# Patient Record
Sex: Female | Born: 1960 | Race: Black or African American | Hispanic: No | Smoking: Never smoker
Health system: Southern US, Community
[De-identification: ages and names within clinical notes are randomized; demographics above are authoritative.]

## PROBLEM LIST (undated history)

## (undated) DIAGNOSIS — K529 Noninfective gastroenteritis and colitis, unspecified: Secondary | ICD-10-CM

## (undated) DIAGNOSIS — Z8711 Personal history of peptic ulcer disease: Secondary | ICD-10-CM

## (undated) DIAGNOSIS — M25559 Pain in unspecified hip: Secondary | ICD-10-CM

## (undated) DIAGNOSIS — D259 Leiomyoma of uterus, unspecified: Secondary | ICD-10-CM

## (undated) DIAGNOSIS — N3941 Urge incontinence: Secondary | ICD-10-CM

## (undated) DIAGNOSIS — Z973 Presence of spectacles and contact lenses: Secondary | ICD-10-CM

## (undated) DIAGNOSIS — Z2821 Immunization not carried out because of patient refusal: Secondary | ICD-10-CM

## (undated) DIAGNOSIS — N3944 Nocturnal enuresis: Secondary | ICD-10-CM

## (undated) DIAGNOSIS — E669 Obesity, unspecified: Secondary | ICD-10-CM

## (undated) HISTORY — DX: Personal history of peptic ulcer disease: Z87.11

## (undated) HISTORY — PX: COLONOSCOPY: SHX174

## (undated) HISTORY — PX: ABDOMINAL HYSTERECTOMY: SHX81

## (undated) HISTORY — DX: Urge incontinence: N39.41

## (undated) HISTORY — DX: Obesity, unspecified: E66.9

## (undated) HISTORY — PX: KNEE ARTHROSCOPY: SUR90

## (undated) HISTORY — PX: WISDOM TOOTH EXTRACTION: SHX21

## (undated) HISTORY — PX: HIP SURGERY: SHX245

## (undated) HISTORY — DX: Presence of spectacles and contact lenses: Z97.3

## (undated) HISTORY — DX: Immunization not carried out because of patient refusal: Z28.21

## (undated) HISTORY — DX: Leiomyoma of uterus, unspecified: D25.9

## (undated) HISTORY — DX: Nocturnal enuresis: N39.44

---

## 1998-02-02 ENCOUNTER — Emergency Department (HOSPITAL_COMMUNITY): Admission: EM | Admit: 1998-02-02 | Discharge: 1998-02-02 | Payer: Self-pay | Admitting: Emergency Medicine

## 2000-11-19 ENCOUNTER — Emergency Department (HOSPITAL_COMMUNITY): Admission: EM | Admit: 2000-11-19 | Discharge: 2000-11-19 | Payer: Self-pay | Admitting: Emergency Medicine

## 2000-11-19 ENCOUNTER — Encounter: Payer: Self-pay | Admitting: Emergency Medicine

## 2001-08-26 ENCOUNTER — Ambulatory Visit (HOSPITAL_COMMUNITY): Admission: RE | Admit: 2001-08-26 | Discharge: 2001-08-26 | Payer: Self-pay | Admitting: *Deleted

## 2001-09-03 ENCOUNTER — Inpatient Hospital Stay (HOSPITAL_COMMUNITY): Admission: RE | Admit: 2001-09-03 | Discharge: 2001-09-06 | Payer: Self-pay | Admitting: *Deleted

## 2002-03-04 ENCOUNTER — Emergency Department (HOSPITAL_COMMUNITY): Admission: EM | Admit: 2002-03-04 | Discharge: 2002-03-04 | Payer: Self-pay | Admitting: Emergency Medicine

## 2002-07-02 ENCOUNTER — Emergency Department (HOSPITAL_COMMUNITY): Admission: EM | Admit: 2002-07-02 | Discharge: 2002-07-02 | Payer: Self-pay | Admitting: Emergency Medicine

## 2006-06-24 ENCOUNTER — Ambulatory Visit: Payer: Self-pay | Admitting: Family Medicine

## 2006-07-23 ENCOUNTER — Ambulatory Visit: Payer: Self-pay | Admitting: Family Medicine

## 2007-07-22 ENCOUNTER — Ambulatory Visit: Payer: Self-pay | Admitting: Family Medicine

## 2007-07-31 ENCOUNTER — Ambulatory Visit: Payer: Self-pay

## 2007-08-26 ENCOUNTER — Ambulatory Visit: Payer: Self-pay | Admitting: Internal Medicine

## 2007-09-01 ENCOUNTER — Ambulatory Visit: Payer: Self-pay

## 2007-09-01 ENCOUNTER — Ambulatory Visit: Payer: Self-pay | Admitting: Internal Medicine

## 2007-09-01 LAB — CONVERTED CEMR LAB
BUN: 4 mg/dL — ABNORMAL LOW (ref 6–23)
CO2: 28 meq/L (ref 19–32)
Calcium: 8.8 mg/dL (ref 8.4–10.5)
Chloride: 105 meq/L (ref 96–112)
Creatinine, Ser: 0.5 mg/dL (ref 0.4–1.2)
GFR calc Af Amer: 172 mL/min
GFR calc non Af Amer: 142 mL/min
Glucose, Bld: 100 mg/dL — ABNORMAL HIGH (ref 70–99)
Potassium: 3.6 meq/L (ref 3.5–5.1)
Pro B Natriuretic peptide (BNP): 6 pg/mL (ref 0.0–100.0)
Sodium: 137 meq/L (ref 135–145)

## 2007-09-03 ENCOUNTER — Ambulatory Visit: Payer: Self-pay | Admitting: Pulmonary Disease

## 2007-09-15 ENCOUNTER — Ambulatory Visit (HOSPITAL_BASED_OUTPATIENT_CLINIC_OR_DEPARTMENT_OTHER): Admission: RE | Admit: 2007-09-15 | Discharge: 2007-09-15 | Payer: Self-pay | Admitting: Pulmonary Disease

## 2007-09-15 ENCOUNTER — Encounter: Payer: Self-pay | Admitting: Pulmonary Disease

## 2007-09-30 ENCOUNTER — Ambulatory Visit: Payer: Self-pay | Admitting: Internal Medicine

## 2007-10-02 ENCOUNTER — Ambulatory Visit: Payer: Self-pay | Admitting: Pulmonary Disease

## 2007-10-02 ENCOUNTER — Telehealth (INDEPENDENT_AMBULATORY_CARE_PROVIDER_SITE_OTHER): Payer: Self-pay | Admitting: *Deleted

## 2007-10-20 ENCOUNTER — Encounter: Payer: Self-pay | Admitting: Pulmonary Disease

## 2007-10-20 DIAGNOSIS — G4730 Sleep apnea, unspecified: Secondary | ICD-10-CM | POA: Insufficient documentation

## 2008-03-19 ENCOUNTER — Ambulatory Visit: Payer: Self-pay | Admitting: Family Medicine

## 2008-03-30 ENCOUNTER — Ambulatory Visit: Payer: Self-pay | Admitting: Family Medicine

## 2008-08-13 ENCOUNTER — Ambulatory Visit: Payer: Self-pay | Admitting: Family Medicine

## 2008-11-02 ENCOUNTER — Ambulatory Visit: Payer: Self-pay | Admitting: Family Medicine

## 2008-11-09 ENCOUNTER — Encounter: Admission: RE | Admit: 2008-11-09 | Discharge: 2008-11-09 | Payer: Self-pay | Admitting: Family Medicine

## 2009-06-06 ENCOUNTER — Ambulatory Visit: Payer: Self-pay | Admitting: Diagnostic Radiology

## 2009-06-06 ENCOUNTER — Ambulatory Visit (HOSPITAL_BASED_OUTPATIENT_CLINIC_OR_DEPARTMENT_OTHER): Admission: RE | Admit: 2009-06-06 | Discharge: 2009-06-06 | Payer: Self-pay | Admitting: Physician Assistant

## 2009-06-09 ENCOUNTER — Encounter: Admission: RE | Admit: 2009-06-09 | Discharge: 2009-06-09 | Payer: Self-pay | Admitting: Family Medicine

## 2009-06-09 LAB — HM MAMMOGRAPHY

## 2009-07-05 ENCOUNTER — Ambulatory Visit: Payer: Self-pay | Admitting: Family Medicine

## 2009-07-21 ENCOUNTER — Ambulatory Visit: Payer: Self-pay | Admitting: Family Medicine

## 2009-12-12 ENCOUNTER — Ambulatory Visit: Payer: Self-pay | Admitting: Physician Assistant

## 2010-09-27 ENCOUNTER — Ambulatory Visit: Payer: Self-pay | Admitting: Family Medicine

## 2010-10-19 ENCOUNTER — Ambulatory Visit
Admission: RE | Admit: 2010-10-19 | Discharge: 2010-10-19 | Payer: Self-pay | Source: Home / Self Care | Attending: Family Medicine | Admitting: Family Medicine

## 2011-02-27 NOTE — Assessment & Plan Note (Signed)
[facility]                            CARDIOLOGY OFFICE NOTE   Sonya White, Sonya White                       MRN:          [id number]  DATE:08/26/2007                            DOB:          04-25-1961    REASON FOR CONSULTATION:  Shortness of breath and chest discomfort.   HISTORY OF PRESENT ILLNESS:  Sonya White is a very pleasant 50 year old  woman with a history of obesity.  She denies any history of  hypertension, hyperlipidemia, no coronary artery disease.  She has never  had a stress test or cardiac catheterization.  She works as a [employer]a [employer] for [employer].   She says over the past 6 months she has had a sensation that her chest  is filling up.  She likens it to almost drowning in a pool.  She says  that it can happen at any time, it lasts about 1 second and then goes  away.  She can feel it coming on.  This Saturday she had multiple  episodes she said that lasted 4-5 seconds and then went away.  She was  minimally short of breath with it, denied any palpitations.  She denies  any orthopnea or PND.  She does have occasional minimal lower extremity  edema.  Functional capacity is very good.   REVIEW OF SYSTEMS:  She denies any nausea and vomiting.  She has  profound fatigue as well as heavy snoring.  She says she can fall asleep  just on a dime.  She denies any bleeding, no focal neurologic defects.  The remainder of the review of systems is negative except for HPI and  problem list.   PROBLEM LIST:  1. Obesity.  2. History of colon ulcers as child.  This has resolved.   MEDICATIONS:  None.   SURGICAL HISTORY:  She is status post hysterectomy in 2002.   SOCIAL HISTORY:  She has 2 children.  She is single, she is here with  her partner.  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  She  denies any tobacco use, occasional alcohol.   FAMILY HISTORY:  Mother died at 13 due to cancer.  She has multiple  aunts on  her father's side that have coronary artery disease.   PHYSICAL EXAMINATION:  She is in no acute distress, she ambulates around  the clinic without any respiratory difficulty.  Blood pressure initially  was 136/96, on manual recheck 122/88, weight is 234 pounds, pulse is 88.  HEENT:  Normal.  NECK:  Supple and thick, there is no JVD, carotids are 2+ bilaterally  without bruits, there is no lymphadenopathy or thyromegaly.  CARDIAC:  PMI is nondisplaced, she has a regular rate and rhythm with no  murmurs, rubs, or gallops.  LUNGS:  Clear.  ABDOMEN:  Obese, nontender, nondistended, no hepatosplenomegaly, no  bruits, no masses appreciated.  EXTREMITIES:  Warm with no cyanosis or clubbing, there is trace edema,  no rash, good distal pulses.  NEURO:  She is alert and oriented x3, cranial nerves are intact, moves  all 4 extremities without difficulty.  Affect is very pleasant.    She has sinus arrhythmia.  She had an echocardiogram which showed a  normal ejection fraction with just minimal left atrial enlargement.   ASSESSMENT:  1. Chest discomfort.  This does not sound like ischemia to me, however      it is possible that she is having atypical symptoms.  Her symptoms      actually sound more like she may be having premature atrial      contractions and premature ventricular contractions.  At this point      we will start with a treadmill Myoview to exclude ischemia given      her strong family history and also put a 48-hour Holter Monitor on      her.  2. Fatigue.  I suspect she has sleep apnea.  Will refer her to Dr.      Craige White for possible sleep study.  3. Lower extremity edema.  We will start her on hydrochlorothiazide      12.5 and potassium 20.  She will get a BMET and a BNP in one week.  4. Borderline hypertension.  We are starting her on      hydrochlorothiazide.  She will be followed with Sonya White.     Sonya White. Bensimhon, MD  Electronically Signed    DRB/MedQ  DD:  08/26/2007  DT: 08/26/2007  Job #: [id number]   cc:   Dr. Feliberto White  Sonya Helling, MD

## 2011-02-27 NOTE — Procedures (Signed)
NAMERAINELLE, SULEWSKI                ACCOUNT NO.:  [id number]   MEDICAL RECORD NO.:  [id number]          PATIENT TYPE:  OUT   LOCATION:  SLEEP CENTER                 FACILITY:  [facility]   PHYSICIAN:  Barbaraann Share, MD,FCCPDATE OF BIRTH:  02-15-63   DATE OF STUDY:  09/15/2007                            NOCTURNAL POLYSOMNOGRAM   REFERRING PHYSICIAN:  Barbaraann Share, MD,FCCP   INDICATION FOR STUDY:  Hypersomnia with sleep apnea.   EPWORTH SLEEPINESS SCORE:  23   MEDICATIONS:   SLEEP ARCHITECTURE:  The patient had a total sleep time of 321 minutes  with no slow wave sleep and only 42 minutes of REM.  Sleep onset latency  was mildly prolonged at 35 minutes and REM onset was normal.  Sleep  efficiency was mildly decreased at 85%.   RESPIRATORY DATA:  The patient was found to have 15 obstructive apneas  and 40 obstructive hypopnea's for an apnea/hypopnea index of 10 event  per hour.  The events occurred primarily during REM and there was  moderate snoring noted throughout.   OXYGEN DATA:  There was O2 desaturation as low as 86% with the patient's  obstructive events.   CARDIAC DATA:  No clinically significant arrhythmias were noted.   MOVEMENT-PARASOMNIA:  The patient was found to have no significant leg  jerks or abdomen behaviors.   IMPRESSIONS-RECOMMENDATIONS:  Mild obstructive sleep apnea/hypopnea  syndrome with an apnea/hypopnea index of 10 events per hour and oxygen  desaturation as low as 86%.  The patient's events occurred primarily  during REM and therefore, the patient may be more symptomatic for this  degree of sleep apnea.  Treatment options include weight loss alone is  applicable, upper airway surgery, oral appliance, and also CPAP.  Clinical correlation is suggested.     Barbaraann Share, MD,FCCP  Diplomate, American Board of Sleep  Medicine  Electronically Signed    [facility]/MEDQ  D:  10/02/2007 08:05:56  T:  10/02/2007 13:09:41  Job:  [id number]

## 2011-02-27 NOTE — Assessment & Plan Note (Signed)
[facility]                            CARDIOLOGY OFFICE NOTE   Sonya White, Sonya White                       MRN:          [id number]  DATE:09/30/2007                            DOB:          01-03-1961    PRIMARY CARE PHYSICIAN:  Dr. Elpidio Galea.   INTERVAL HISTORY:  Sonya White is a very pleasant 50 year old woman with  a history of obesity and no other significant medical history.  We saw  her last month for the first time for evaluation of some atypical chest  pain and shortness of breath.  Since that time, she has undergone a  Myoview, echocardiogram, and a Holter monitor.  These were all  completely normal.  She has also been to Dr. Craige Cotta and had a sleep study,  the results of which are pending.  She returns today for routine  followup.  She says she continues to get a sensation of something  filling up in her chest.  This happens just about every day, it lasts  just a couple of seconds and resolves.  She denies any overt chest pain  or significant dyspnea.  She says she can feel it coming and then it  just goes away.  She denies any syncope or presyncope with it.  She has  not had frank palpitations.  She did say that she had several of these  episodes while on the monitor.  We did not detect anything, although  there was no a diary submitted.   CURRENT MEDICATIONS:  None.   PHYSICAL EXAMINATION:  Sh is well-appearing in no acute distress.  Ambulates around the clinic without any respiratory difficulty.  Blood pressure is 119/90.  Heart rate is 100.  Weight is 229.  HEENT:  Normal.  NECK:  Supple.  No JVD.  Carotids are 2+ bilaterally without any bruits.  There is no lymphadenopathy or thyromegaly.  CARDIAC:  PMI is nondisplaced.  She has a regular rate and rhythm.  No  murmurs, rubs or gallops.  LUNGS:  Clear.  ABDOMEN:  Obese, nontender, nondistended.  No hepatosplenomegaly.  No  bruits.  No masses.  Good bowel sounds.  EXTREMITIES:  Warm  with no clubbing, cyanosis or edema.  No rash.  NEURO:  Alert and oriented x3.  Cranial nerves II-XII are intact.  Moves  all four extremities without difficulty.  Affect is pleasant.   ASSESSMENT/PLAN:  Abnormal chest sensation:  Given her symptoms, my  suspicion is that she is probably having premature atrial contractions  or premature ventricular contractions, and we have just missed these on  the monitor.  We have given her a prescription for Toprol 25 mg a day to  see if this helps at all.  If if does not help, I told her she could try  50 mg a day.  Also, we did discuss the possibility of it being reflux  disease, although the symptoms would be atypical.  Should they continue,  we have suggested that she may even try a little bit of over-the-counter  Prilosec.  She will try these methods, and we will  see her back in six  months for routine  followup.  We also reminded her of the need to avoid caffeine as much as  possible, which she has been doing.     Sonya Buckles. Bensimhon, MD  Electronically Signed    DRB/MedQ  DD: 09/30/2007  DT: 09/30/2007  Job #: [id number]   cc:   Dr. Elpidio Galea  Sonya Helling, MD

## 2011-02-27 NOTE — Assessment & Plan Note (Signed)
[facility]                             PULMONARY OFFICE NOTE   Sonya White, Sonya White                       MRN:          [id number]  DATE:09/03/2007                            DOB:          07/25/1961    SLEEP MEDICINE CONSULTATION:   HISTORY OF PRESENT ILLNESS:  The patient is a very pleasant 50 year old  black female who I have been asked to see for possible obstructive sleep  apnea.  The patient states that she has been noted to have very loud  snoring but no one has ever mentioned pauses in her breathing during  sleep.  She does admit to occasional choking arousals.  The patient  typically gets to bed between 11 and 1 and gets up at 7 a.m. to start  her day.  She does not feel rested upon arising.  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  She will often roll down her windows and play  loud music in order to stay awake.  If she ever gets very sleepy, she  will pull over and take breaks.  The patient also notes severe  sleepiness during periods of inactivity such as watching TV or movies  and has even fallen asleep on rare occasions with conversation.  Of  note, her weight is up 20 pounds over the last few years.   PAST MEDICAL HISTORY:  1. A C-section in the past.  2. Status post hysterectomy.  3. The patient has also seen cardiology for a feeling of palpitations.   CURRENT MEDICATIONS:  None.   The patient has no known drug allergies.   SOCIAL HISTORY:  She is divorced and has children.  She has never  smoked.   FAMILY HISTORY:  Remarkable for her father having congestive heart  failure and mother having multiple myeloma.   REVIEW OF SYSTEMS:  As per history of present illness.  Also see patient  intake form documented on the chart.   PHYSICAL EXAM:  GENERAL:  She is an obese black female in no acute  distress.  Blood pressure is 126/86, pulse 79, temperature  is 99, weight is 238  pounds.  She is 5 feet 3 inches tall.  O2 saturation on room air is 97%.  HEENT:  Pupils equal, round and reactive to light and accommodation.  Extraocular muscles are intact.  Nares show turbinate hypertrophy but  they are patent.  Oropharynx does show significant tissue redundancy  with elongation of the soft palate and uvula and a large tongue for her  oral cavity.  NECK:  Supple without JVD or lymphadenopathy.  There is no palpable  thyromegaly.  CHEST:  Totally clear.  CARDIAC:  Regular rate and rhythm.  No murmurs, rubs or gallops.  ABDOMEN:  Soft, nontender, with good bowel sounds.  GENITAL, RECTAL, BREASTS:  Exams not done and not indicated.  Lower extremities are without edema.  Pulses are intact distally.  NEUROLOGIC:  Alert and oriented with no obvious motor deficits.   IMPRESSION:  Probable obstructive sleep apnea.  The patient gives a very  good history for this and certainly has abnormal upper airway anatomy  and is obese.  I had a long discussion with her about the  pathophysiology of sleep apnea including the short-term quality of life  issues and the long-term cardiovascular issues.  At this point in time I  think there is enough suspicion to support proceeding with nocturnal  polysomnogram.   PLAN:  1. Schedule for sleep study.  2. I reminded the patient of her moral responsibility not to drive if      she is sleepy.  3. Work on weight loss.  4. The patient will follow up after the above.     Barbaraann Share, MD,FCCP  Electronically Signed    [facility]/MedQ  DD: 09/03/2007  DT: 09/04/2007  Job #: [id number]   cc:   Bevelyn Buckles. Bensimhon, MD  Sharlot Gowda, M.D.

## 2011-03-09 ENCOUNTER — Emergency Department (HOSPITAL_BASED_OUTPATIENT_CLINIC_OR_DEPARTMENT_OTHER)
Admission: EM | Admit: 2011-03-09 | Discharge: 2011-03-09 | Disposition: A | Discharge status: Home / Self Care | Payer: BC Managed Care – PPO | Attending: Emergency Medicine | Admitting: Emergency Medicine

## 2011-03-09 DIAGNOSIS — H9209 Otalgia, unspecified ear: Secondary | ICD-10-CM | POA: Insufficient documentation

## 2011-03-09 DIAGNOSIS — H6690 Otitis media, unspecified, unspecified ear: Secondary | ICD-10-CM | POA: Insufficient documentation

## 2011-05-22 ENCOUNTER — Ambulatory Visit (INDEPENDENT_AMBULATORY_CARE_PROVIDER_SITE_OTHER): Payer: BC Managed Care – PPO | Admitting: Medical

## 2011-05-22 ENCOUNTER — Telehealth: Payer: Self-pay | Admitting: *Deleted

## 2011-05-22 ENCOUNTER — Encounter: Payer: Self-pay | Admitting: Medical

## 2011-05-22 ENCOUNTER — Ambulatory Visit
Admission: RE | Admit: 2011-05-22 | Discharge: 2011-05-22 | Disposition: A | Discharge status: Home / Self Care | Payer: BC Managed Care – PPO | Source: Ambulatory Visit | Attending: Medical | Admitting: Medical

## 2011-05-22 VITALS — BP 132/90 | HR 60 | Temp 98.1°F | Ht 62.0 in | Wt 231.0 lb

## 2011-05-22 DIAGNOSIS — M79609 Pain in unspecified limb: Secondary | ICD-10-CM

## 2011-05-22 DIAGNOSIS — M549 Dorsalgia, unspecified: Secondary | ICD-10-CM

## 2011-05-22 DIAGNOSIS — M7060 Trochanteric bursitis, unspecified hip: Secondary | ICD-10-CM

## 2011-05-22 DIAGNOSIS — M76899 Other specified enthesopathies of unspecified lower limb, excluding foot: Secondary | ICD-10-CM

## 2011-05-22 DIAGNOSIS — M79605 Pain in left leg: Secondary | ICD-10-CM

## 2011-05-22 MED ORDER — MELOXICAM 7.5 MG PO TABS: 7.5000 mg | ORAL_TABLET | Freq: Two times a day (BID) | ORAL | Status: DC

## 2011-05-22 MED ORDER — TIZANIDINE HCL 4 MG PO TABS: ORAL_TABLET | ORAL | Status: AC

## 2011-05-22 NOTE — Telephone Encounter (Addendum)
Message copied by Dorthula Perfect on Tue May 22, 2011  5:08 PM ------      Message from: Aleen Campi, DAVID S      Created: Tue May 22, 2011  1:24 PM       Only very slight degenerative/aging changes noted, and increase stool in colon.  Make sure she isn't having a lot of constipation.  Otherwise, use the meds, stretching, and exercise as we discussed.  Recheck in 2wk.   Pt notified of xray results and is scheduled to return on 06-12-11 at 8:30 am for a follow up.  CM, LPN

## 2011-05-22 NOTE — Progress Notes (Signed)
Subjective:   HPI Sonya White is a 50 y.o. female who presents for possible sciatica.  She notes problems with this pain for last 2-3 years, at times Meloxicam helped.  Lately the pain has flared up more, using OTC Ibuprofen.   She has just been dealing with the pain and decided she was tired of the pain.  She has been seen for this prior.  She notes prior getting a cortisone shot every 3-4 months.  She notes pain in left hip and around to buttock, and more lateral hip pain on the right.  Denies numbness and tingling.  She notes physical therapy last year for same issue.   Pain can be at rest, in the bed, or with walking.  No other particular activity worsens the pain.  She is a Naval architect for [employer].  No other aggravating or relieving factors.  No other c/o.  The following portions of the patient's history were reviewed and updated as appropriate: allergies, current medications, past family history, past medical history, past social history, past surgical history and problem list.  Past Medical History  Diagnosis Date  . Obesity   . Uterine fibroid   . Urge incontinence   . Nocturnal enuresis     Review of Systems Gen: no fever, chills, sweats, weight loss Skin: no rash Neuro: no numbness, tingling, weakness MSK: no joint pain Back: low back pain, radiates left and right hips, "Sciatica" flaring up Abdomen: nontender, no bowel changes, no blood in stool GU: no dysuria, blood in urine, urinary changes     Objective:   Physical Exam  General appearance: alert, no distress, WD/WN, black female, obese Skin: warm, dry Abdomen: nontender MSK: tender over bilat trochanteric bursa, pain with left hip external ROM, but otherwise LE non tender, normal ROM Back: tender along bilat SI joints, tender over left sciatic region, but ROM full, -SLR Neuro: LE normal strength and sensation, DTRs normal, normal heel and toe walk    Assessment :    Encounter Diagnoses  Name Primary?  . Back  pain Yes  . Leg pain, left   . Trochanteric bursitis       Plan:     I think she has several things going on.  Likely some left side sciatica, bilat trochanteric bursitis, and probably tension in the back.   I don't see any evidence of prior xray.  She denies prior MRI L spine. She is tender in SI region bilat, tender over sciatic notch with associated sciatic symptoms on the left, and also tender over bilat trochanteric bursa.  We will get plain xray.  Of note, she did PT x 24mo last year, has been using OTC Ibuprofen, and is overweight.  We discussed the need to lose weight.  I advised she begin pool exercise - aerobics vs lap swim.  She has access to a pool.  Script for Meloxicam since that helped in the past.  Tizanidine script as well QHS.  Will call with xray results.

## 2011-05-26 ENCOUNTER — Encounter (HOSPITAL_BASED_OUTPATIENT_CLINIC_OR_DEPARTMENT_OTHER): Payer: Self-pay | Admitting: Emergency Medicine

## 2011-05-26 ENCOUNTER — Emergency Department (HOSPITAL_BASED_OUTPATIENT_CLINIC_OR_DEPARTMENT_OTHER)
Admission: EM | Admit: 2011-05-26 | Discharge: 2011-05-26 | Disposition: A | Discharge status: Home / Self Care | Payer: BC Managed Care – PPO | Attending: Emergency Medicine | Admitting: Emergency Medicine

## 2011-05-26 DIAGNOSIS — M62838 Other muscle spasm: Secondary | ICD-10-CM

## 2011-05-26 DIAGNOSIS — M5430 Sciatica, unspecified side: Secondary | ICD-10-CM | POA: Insufficient documentation

## 2011-05-26 DIAGNOSIS — E669 Obesity, unspecified: Secondary | ICD-10-CM | POA: Insufficient documentation

## 2011-05-26 DIAGNOSIS — M25559 Pain in unspecified hip: Secondary | ICD-10-CM | POA: Insufficient documentation

## 2011-05-26 HISTORY — DX: Pain in unspecified hip: M25.559

## 2011-05-26 MED ORDER — OXYCODONE-ACETAMINOPHEN 5-325 MG PO TABS: 2.0000 | ORAL_TABLET | ORAL | Status: AC | PRN

## 2011-05-26 NOTE — ED Provider Notes (Signed)
History     CSN: [id number] Arrival date & time: 05/26/2011  8:51 AM  Chief Complaint  Patient presents with  . Hip Pain  . Sciatica   Patient is a 50 y.o. female presenting with hip pain. The history is provided by the patient.  Hip Pain This is a recurrent problem. The current episode started more than 2 days ago. The problem occurs constantly. The problem has not changed since onset.Pertinent negatives include no chest pain, no abdominal pain, no headaches and no shortness of breath. The symptoms are aggravated by walking, bending and standing. The symptoms are relieved by medications, position, relaxation, lying down and NSAIDs (The patient has seen her primary care physician for the same problem approximately 5 days ago, at which time she tells me x-rays of her low back and hips were performed without any significant acute findings. She was prescribed meloxicam and tizanidine). The treatment provided moderate relief.    Past Medical History  Diagnosis Date  . Obesity   . Uterine fibroid   . Urge incontinence   . Nocturnal enuresis   . Hip joint pain     Past Surgical History  Procedure Date  . Abdominal hysterectomy     partial  . Cesarean section     History reviewed. No pertinent family history.  History  Substance Use Topics  . Smoking status: Never Smoker   . Smokeless tobacco: Never Used  . Alcohol Use: 0.5 oz/week    1 drink(s) per week     occasional    OB History    Grav Para Term Preterm Abortions TAB SAB Ect Mult Living                  Review of Systems  Constitutional: Negative for fever, activity change and fatigue.  HENT: Negative for neck pain and neck stiffness.   Respiratory: Negative.  Negative for shortness of breath.   Cardiovascular: Negative.  Negative for chest pain.  Gastrointestinal: Negative for nausea, vomiting, abdominal pain and abdominal distention.  Genitourinary: Negative for dysuria, flank pain and difficulty urinating.    Musculoskeletal: Positive for myalgias. Negative for back pain, joint swelling and gait problem.  Skin: Negative for color change and rash.  Neurological: Negative for weakness, numbness and headaches.  Psychiatric/Behavioral: Negative.     Physical Exam  BP 125/83  Pulse 77  Temp(Src) 98.8 F (37.1 C) (Oral)  Resp 18  SpO2 97%  Physical Exam  Nursing note and vitals reviewed. Constitutional: She is oriented to person, place, and time. She appears well-nourished. She appears distressed.  HENT:  Head: Normocephalic and atraumatic.  Eyes: EOM are normal. Pupils are equal, round, and reactive to light.  Neck: Normal range of motion. Neck supple.  Cardiovascular: Normal rate, regular rhythm and normal heart sounds.  Exam reveals no gallop and no friction rub.   No murmur heard. Pulmonary/Chest: Effort normal and breath sounds normal. No respiratory distress. She has no wheezes. She has no rales.  Abdominal: Soft. Bowel sounds are normal. She exhibits no distension and no mass. There is no tenderness. There is no rebound and no guarding.  Musculoskeletal: Normal range of motion. She exhibits tenderness. She exhibits no edema.       Lumbar back: She exhibits normal range of motion, no tenderness, no bony tenderness, no swelling, no edema, no deformity, no pain and no spasm.       Legs: Neurological: She is alert and oriented to person, place, and time. She has  normal reflexes. She exhibits normal muscle tone.  Skin: Skin is warm and dry. No rash noted. No erythema.  Psychiatric: She has a normal mood and affect.    ED Course  Procedures  MDM I do not believe that the patient is having sciatica and she has no lumbar pain, and no radiation of pain down the posterior thigh this. She has point tenderness at the bilateral buttock as well as anterior thigh, with palpable muscle tightness here. Evaluating her posture on standing, she has inadequate engagement of the abdominal musculature.  Believe this is placing strain on the buttocks and thighs to maintain her posture. I think this is the cause of her problem. I have instructed the patient in exercises to strengthen the abdominal muscles. I will also add a narcotic pain medication to her current regimen of nonsteroidal anti-inflammatory agent as well as the current muscle relaxant. The patient states her understanding of and agreement with this plan of care.      Felisa Bonier, MD 05/26/11 [phone]

## 2011-05-26 NOTE — ED Notes (Signed)
Pt states that she has been having bilateral hip pain with sciatic pain x 2 years.  Pt states for the last two weeks it has been unbearable.  Pt saw MD on Wednesday, had xray and medication but it is not helping.  No known injury.  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

## 2011-06-08 ENCOUNTER — Encounter: Payer: Self-pay | Admitting: Family Medicine

## 2011-06-12 ENCOUNTER — Ambulatory Visit: Payer: BC Managed Care – PPO | Admitting: Medical

## 2011-07-02 ENCOUNTER — Ambulatory Visit: Payer: BC Managed Care – PPO | Admitting: Medical

## 2011-07-03 ENCOUNTER — Ambulatory Visit: Payer: BC Managed Care – PPO | Admitting: Medical

## 2011-10-31 ENCOUNTER — Ambulatory Visit (INDEPENDENT_AMBULATORY_CARE_PROVIDER_SITE_OTHER): Payer: BC Managed Care – PPO | Admitting: Medical

## 2011-10-31 ENCOUNTER — Encounter: Payer: Self-pay | Admitting: Medical

## 2011-10-31 VITALS — BP 132/80 | HR 60 | Temp 98.3°F | Resp 16 | Wt 234.0 lb

## 2011-10-31 DIAGNOSIS — E669 Obesity, unspecified: Secondary | ICD-10-CM

## 2011-10-31 DIAGNOSIS — M7061 Trochanteric bursitis, right hip: Secondary | ICD-10-CM | POA: Insufficient documentation

## 2011-10-31 DIAGNOSIS — M76899 Other specified enthesopathies of unspecified lower limb, excluding foot: Secondary | ICD-10-CM

## 2011-10-31 DIAGNOSIS — M549 Dorsalgia, unspecified: Secondary | ICD-10-CM | POA: Insufficient documentation

## 2011-10-31 DIAGNOSIS — M533 Sacrococcygeal disorders, not elsewhere classified: Secondary | ICD-10-CM | POA: Insufficient documentation

## 2011-10-31 MED ORDER — TIZANIDINE HCL 4 MG PO TABS: 4.0000 mg | ORAL_TABLET | Freq: Four times a day (QID) | ORAL | Status: AC | PRN

## 2011-10-31 NOTE — Progress Notes (Signed)
Subjective: Here today for recheck on multiple symptoms.  I saw her a few months ago for back pain, sacroiliac joint pain, trochanteric bursitis, and we discussed weight loss, exercise and stretching, as well as medication for her multiple pains.  She is exercising regularly now, plans to lose significant weight over the next year.  She has not been to the pool yet, but is doing regular exercise.  She has been doing a type of step aerobics with a DVD. Since last visit she notes significant improvement in her pains with the use of tizanidine at bedtime. She would like a refill on this.  Her main concern today is pain along her right hip over the trochanter bursa. She has had this before in the past and has received injections for this in the past.  She requests an injection today. Last injection over a year ago.  She has recently aggravated the pain with exercise, using ice and heat, ibuprofen 800 mg twice a day.  Past Medical History  Diagnosis Date  . Obesity   . Uterine fibroid   . Urge incontinence   . Nocturnal enuresis   . Hip joint pain   . Obesity   . Uterine fibroid   . Urge incontinence     Review of systems: Gen.: No fever, chills, sweats, weight changes Neuro: No numbness tingling or weakness MSK: Back pain improved, SI joint pain improved    Objective:   Physical Exam  Filed Vitals:   10/31/11 0945  BP: 132/80  Pulse: 60  Temp: 98.3 F (36.8 C)  Resp: 16    General appearance: alert, no distress, WD/WN Back: Not particularly tender today, range of motion normal without pain MSK: Mildly tender today over the right trochanteric bursa, otherwise legs not particularly tender Pulses: 2+ Neuro: Normal strength and sensation of lower extremities  Assessment and Plan :    Encounter Diagnoses  Name Primary?  . Trochanteric bursitis of right hip Yes  . Sacroiliac joint pain   . Back pain   . Obesity    Trochanteric bursitis of the right hip- discussed treatment  options. She requests an injection today. Discussed risk and benefits of procedure, and she gives consent.  Dr. Susann Givens, my supervising physician performed the procedure.  Cleaned and prepped in usual sterile fashion.  40mg  of Kenalog and 3cc 2% lidocaine without epi injected into right greater trochanteric bursa.  There was immediate relief.  No blood loss.  Pt handled procedure well.    SI joint pain and back pain-much improved.  Advise she continue regular exercise, diet changes to help lose weight.  She can continue tizanidine as needed at bedtime, prescription refilled. She can continue over-the-counter Aleve as needed for pain.  Obesity-discussed lifestyle changes, continue exercise  RTC as needed

## 2012-01-03 ENCOUNTER — Telehealth: Payer: Self-pay | Admitting: Medical

## 2012-01-04 ENCOUNTER — Other Ambulatory Visit: Payer: Self-pay | Admitting: Medical

## 2012-01-04 DIAGNOSIS — M79605 Pain in left leg: Secondary | ICD-10-CM

## 2012-01-04 MED ORDER — MELOXICAM 7.5 MG PO TABS: 7.5000 mg | ORAL_TABLET | Freq: Two times a day (BID) | ORAL | Status: DC

## 2012-01-04 NOTE — Telephone Encounter (Signed)
Patient was notified that her medication was sent out to the pharmacy. CLS

## 2012-01-04 NOTE — Telephone Encounter (Signed)
i sent meloxicam to pharmacy.    Looking back in chart, i think her last physical was over 1 year ago.  I would recommend a yearly physical at this time.

## 2012-02-26 ENCOUNTER — Other Ambulatory Visit: Payer: Self-pay | Admitting: Medical

## 2012-02-26 NOTE — Telephone Encounter (Signed)
Is this ok?

## 2012-05-27 ENCOUNTER — Other Ambulatory Visit: Payer: Self-pay | Admitting: Family Medicine

## 2012-05-27 DIAGNOSIS — Z1231 Encounter for screening mammogram for malignant neoplasm of breast: Secondary | ICD-10-CM

## 2012-06-02 ENCOUNTER — Ambulatory Visit: Payer: BC Managed Care – PPO

## 2012-10-17 ENCOUNTER — Encounter (HOSPITAL_BASED_OUTPATIENT_CLINIC_OR_DEPARTMENT_OTHER): Payer: Self-pay | Admitting: *Deleted

## 2012-10-17 ENCOUNTER — Emergency Department (HOSPITAL_BASED_OUTPATIENT_CLINIC_OR_DEPARTMENT_OTHER)
Admission: EM | Admit: 2012-10-17 | Discharge: 2012-10-17 | Disposition: A | Discharge status: Home / Self Care | Payer: BC Managed Care – PPO | Attending: Emergency Medicine | Admitting: Emergency Medicine

## 2012-10-17 DIAGNOSIS — R05 Cough: Secondary | ICD-10-CM | POA: Insufficient documentation

## 2012-10-17 DIAGNOSIS — R51 Headache: Secondary | ICD-10-CM | POA: Insufficient documentation

## 2012-10-17 DIAGNOSIS — Z8739 Personal history of other diseases of the musculoskeletal system and connective tissue: Secondary | ICD-10-CM | POA: Insufficient documentation

## 2012-10-17 DIAGNOSIS — Z79899 Other long term (current) drug therapy: Secondary | ICD-10-CM | POA: Insufficient documentation

## 2012-10-17 DIAGNOSIS — IMO0001 Reserved for inherently not codable concepts without codable children: Secondary | ICD-10-CM | POA: Insufficient documentation

## 2012-10-17 DIAGNOSIS — Z87448 Personal history of other diseases of urinary system: Secondary | ICD-10-CM | POA: Insufficient documentation

## 2012-10-17 DIAGNOSIS — B9789 Other viral agents as the cause of diseases classified elsewhere: Secondary | ICD-10-CM

## 2012-10-17 DIAGNOSIS — J988 Other specified respiratory disorders: Secondary | ICD-10-CM | POA: Insufficient documentation

## 2012-10-17 DIAGNOSIS — E669 Obesity, unspecified: Secondary | ICD-10-CM | POA: Insufficient documentation

## 2012-10-17 DIAGNOSIS — R0602 Shortness of breath: Secondary | ICD-10-CM | POA: Insufficient documentation

## 2012-10-17 DIAGNOSIS — R059 Cough, unspecified: Secondary | ICD-10-CM | POA: Insufficient documentation

## 2012-10-17 MED ORDER — ALBUTEROL SULFATE (5 MG/ML) 0.5% IN NEBU: 5.0000 mg | INHALATION_SOLUTION | Freq: Once | RESPIRATORY_TRACT | Status: DC

## 2012-10-17 MED ORDER — ALBUTEROL SULFATE HFA 108 (90 BASE) MCG/ACT IN AERS
2.0000 | INHALATION_SPRAY | RESPIRATORY_TRACT | Status: DC | PRN
  Administered 2012-10-17: 2 via RESPIRATORY_TRACT
  Filled 2012-10-17: qty 6.7

## 2012-10-17 MED ORDER — IPRATROPIUM BROMIDE 0.02 % IN SOLN: 0.5000 mg | Freq: Once | RESPIRATORY_TRACT | Status: DC

## 2012-10-17 MED ORDER — AEROCHAMBER PLUS FLO-VU MEDIUM MISC
1.0000 | Freq: Once | Status: AC
  Administered 2012-10-17: 1
  Filled 2012-10-17: qty 1

## 2012-10-17 NOTE — ED Notes (Signed)
Diarrhea, weakness, cold symptoms 2 weeks ago. She felt like she was getting better then she developed chills, sore throat,  a headache, chest and neck soreness. She drives a truck for a living and can not take any OTC medications that cause drowsiness.

## 2012-10-17 NOTE — ED Provider Notes (Signed)
History     CSN: [id number]  Arrival date & time 10/17/12  2058   First MD Initiated Contact with Patient 10/17/12 2245      Chief Complaint  Patient presents with  . URI    (Consider location/radiation/quality/duration/timing/severity/associated sxs/prior treatment) HPI Complains of generalized weakness nasal congestion nonproductive cough onset 3 days ago. Symptoms accompanied by sore throat headache and diffuse myalgias and sore neck. Accompanying symptoms include diarrhea. She had 3 episodes of diarrhea today no blood per rectum no nausea or vomiting. Treated with Mucinex and Tylenol cold medicine without relief Past Medical History  Diagnosis Date  . Obesity   . Uterine fibroid   . Urge incontinence   . Nocturnal enuresis   . Hip joint pain   . Obesity   . Uterine fibroid   . Urge incontinence     Past Surgical History  Procedure Date  . Cesarean section   . Abdominal hysterectomy     partial    Family History  Problem Relation Age of Onset  . Cancer Mother     History  Substance Use Topics  . Smoking status: Never Smoker   . Smokeless tobacco: Never Used  . Alcohol Use: 0.5 oz/week    1 drink(s) per week     Comment: occasional    OB History    Grav Para Term Preterm Abortions TAB SAB Ect Mult Living                  Review of Systems  HENT: Positive for congestion.   Respiratory: Positive for cough and shortness of breath.   Musculoskeletal: Positive for myalgias.  Neurological: Positive for weakness and headaches.  All other systems reviewed and are negative.    Allergies  Review of patient's allergies indicates no known allergies.  Home Medications   Current Outpatient Rx  Name  Route  Sig  Dispense  Refill  . MELOXICAM 7.5 MG PO TABS      TAKE 1 TABLET (7.5 MG TOTAL) BY MOUTH 2 (TWO) TIMES DAILY.   60 tablet   1     BP 162/97  Pulse 93  Temp 99.1 F (37.3 C) (Oral)  Resp 20  SpO2 99%  Physical Exam  Nursing note and  vitals reviewed. Constitutional: She appears well-developed and well-nourished.  HENT:  Head: Normocephalic and atraumatic.  Right Ear: External ear normal.  Left Ear: External ear normal.  Mouth/Throat: Oropharynx is clear and moist.       Bilateral tympanic membranes normal. Nasal congestion  Eyes: Conjunctivae normal are normal. Pupils are equal, round, and reactive to light.  Neck: Neck supple. No tracheal deviation present. No thyromegaly present.  Cardiovascular: Normal rate and regular rhythm.   No murmur heard. Pulmonary/Chest: Effort normal and breath sounds normal.  Abdominal: Soft. Bowel sounds are normal. She exhibits no distension. There is no tenderness.       Obese  Musculoskeletal: Normal range of motion. She exhibits no edema and no tenderness.  Lymphadenopathy:    She has no cervical adenopathy.  Neurological: She is alert. Coordination normal.  Skin: Skin is warm and dry. No rash noted.  Psychiatric: She has a normal mood and affect.    ED Course  Procedures (including critical care time)  Labs Reviewed - No data to display No results found.   No diagnosis found.  Date: 10/17/2012  Rate: 85  Rhythm: normal sinus rhythm  QRS Axis: normal  Intervals: normal  ST/T Wave abnormalities: nonspecific  T wave changes  Conduction Disutrbances:none  Narrative Interpretation:   Old EKG Reviewed: none available  11:15 PM feels improved after treatment with albuterol HFA inhaler MDM  Plan albuterol inhaler to use 2 puffs every 4 hours when necessary shortness of breath or cough. Tylenol for aches blood pressure recheck 3 weeks Diagnosis #1 viral respiratory illness #2 elevated blood pressure        Doug Sou, MD 10/17/12 2321

## 2012-10-17 NOTE — Patient Instructions (Signed)
Pt instructed on the proper use of administering albuteral mdi with aerochamber pt tolerated well.

## 2013-05-15 ENCOUNTER — Encounter: Payer: Self-pay | Admitting: Medical

## 2013-05-15 ENCOUNTER — Ambulatory Visit (INDEPENDENT_AMBULATORY_CARE_PROVIDER_SITE_OTHER): Payer: BC Managed Care – PPO | Admitting: Medical

## 2013-05-15 ENCOUNTER — Telehealth: Payer: Self-pay | Admitting: *Deleted

## 2013-05-15 VITALS — BP 140/80 | HR 80 | Temp 98.0°F | Ht 64.0 in | Wt 241.0 lb

## 2013-05-15 DIAGNOSIS — Z1211 Encounter for screening for malignant neoplasm of colon: Secondary | ICD-10-CM

## 2013-05-15 DIAGNOSIS — M76899 Other specified enthesopathies of unspecified lower limb, excluding foot: Secondary | ICD-10-CM

## 2013-05-15 DIAGNOSIS — M7061 Trochanteric bursitis, right hip: Secondary | ICD-10-CM

## 2013-05-15 MED ORDER — CYCLOBENZAPRINE HCL 10 MG PO TABS: ORAL_TABLET | ORAL | Status: DC

## 2013-05-15 NOTE — Telephone Encounter (Signed)
Called patient to let her know that she is schedule with Dr.Mann for consult for colonoscopy on 05/28/13 @ 8:45am. Please arrive at 8:30 am. Their address is [address].

## 2013-05-15 NOTE — Patient Instructions (Signed)

## 2013-05-15 NOTE — Progress Notes (Signed)
Subjective:  Here for sciatic nerve pain, right side.  Gets flare up of this intermittent in past several years.  Last visit here 1/13 for same, had bursitis injection at that time.  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  Pain mostly right hip.   No other aggravating or relieving factors.  No numbness, tingling, weakness, abdominal pain.   Took flexeril recently from her sister, and this worked Firefighter.  Wants a screening colonoscopy now that she is 52yo.  Gets DOT physical at another office.   Past Medical History  Diagnosis Date  . Obesity   . Uterine fibroid   . Urge incontinence   . Nocturnal enuresis   . Hip joint pain   . Obesity   . Uterine fibroid   . Urge incontinence      ROS as in subjective  Objective: Gen: pleasant obese AA female, nad Skin: no erythema or ecchymosis MSK: tender over right greater trochanter bursa, otherwise nontender, normal hip and LE ROM, no deformity Back: nontender, normal ROM Neuro: nonfocal Pulses normal Ext: no edema   Assessment: Encounter Diagnoses  Name Primary?  . Trochanteric bursitis, right Yes  . Special screening for malignant neoplasms, colon    Plan: Trochanteric bursitis - offered injection, but she declines . Has had numerous prior .  Begin flexeril prn as this has helped, Aleve OTC, use the stretches, ice and heat as we discussed.  F/u if not improving.  Will refer for first screening colonoscopy  F/u soon for physical

## 2013-06-09 ENCOUNTER — Encounter: Payer: Self-pay | Admitting: Medical

## 2013-06-15 DIAGNOSIS — Z2821 Immunization not carried out because of patient refusal: Secondary | ICD-10-CM

## 2013-06-15 HISTORY — DX: Immunization not carried out because of patient refusal: Z28.21

## 2013-07-07 ENCOUNTER — Ambulatory Visit (INDEPENDENT_AMBULATORY_CARE_PROVIDER_SITE_OTHER): Payer: BC Managed Care – PPO | Admitting: Medical

## 2013-07-07 ENCOUNTER — Encounter: Payer: Self-pay | Admitting: Medical

## 2013-07-07 VITALS — BP 120/80 | HR 82 | Temp 98.5°F | Resp 16 | Ht 64.2 in | Wt 238.0 lb

## 2013-07-07 DIAGNOSIS — Z0000 Encounter for general adult medical examination without abnormal findings: Secondary | ICD-10-CM

## 2013-07-07 DIAGNOSIS — E669 Obesity, unspecified: Secondary | ICD-10-CM

## 2013-07-07 DIAGNOSIS — Z1239 Encounter for other screening for malignant neoplasm of breast: Secondary | ICD-10-CM

## 2013-07-07 LAB — POCT URINALYSIS DIPSTICK
Bilirubin, UA: NEGATIVE
Blood, UA: NEGATIVE
Glucose, UA: NEGATIVE
Nitrite, UA: NEGATIVE
Spec Grav, UA: 1.01

## 2013-07-07 LAB — LIPID PANEL
Cholesterol: 141 mg/dL (ref 0–200)
HDL: 42 mg/dL (ref >39)
LDL Cholesterol: 89 mg/dL (ref 0–99)
Triglycerides: 52 mg/dL (ref <150)

## 2013-07-07 MED ORDER — PHENTERMINE-TOPIRAMATE ER 3.75-23 MG PO CP24: 1.0000 | ORAL_CAPSULE | Freq: Every day | ORAL | Status: DC

## 2013-07-07 MED ORDER — PHENTERMINE-TOPIRAMATE ER 7.5-46 MG PO CP24: 1.0000 | ORAL_CAPSULE | Freq: Every day | ORAL | Status: DC

## 2013-07-07 NOTE — Progress Notes (Signed)
Subjective:   HPI  Sonya White is a 52 y.o. female who presents for a complete physical.  Accompanied by her partner, [place].   Preventative care: Last ophthalmology visit:Dr. Shea Evans Last dental visit: Dr. Jeanice Lim Last colonoscopy: yes but doesn't remember the year Last mammogram:2012 Last gynecological exam:? Last EKG:07/22/2007 Last labs:n/a  Prior vaccinations: TD or Tdap:07/23/2006 Influenza: doesn't want the vaccine Pneumococcal:n/a Shingles/Zostavax:n/a  Advanced directive:n/a Health care power of attorney:n/a Living will:n/a  Concerns: Obesity - difficulty losing weight, grabs food on the go, very busy.  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  Is active, exercises, eats a lot of healthy foods, but does grab some fast food on the go regularly.  Reviewed their medical, surgical, family, social, medication, and allergy history and updated chart as appropriate.  Past Medical History  Diagnosis Date  . Nocturnal enuresis     history of, resolved  . Hip joint pain   . Obesity   . Uterine fibroid     s/p hysterectomy  . Urge incontinence   . Wears glasses   . Influenza vaccine refused 9/14    Past Surgical History  Procedure Laterality Date  . Cesarean section    . Wisdom tooth extraction    . Colonoscopy      pending 08/10/13, Dr. Loreta Ave  . Abdominal hysterectomy      due to fibroids; partial; has both ovaries    History   Social History  . Marital Status: Single    Spouse Name: N/A    Number of Children: N/A  . Years of Education: N/A   Occupational History  . Not on file.   Social History Main Topics  . Smoking status: Never Smoker   . Smokeless tobacco: Never Used  . Alcohol Use: 12.0 oz/week    20 Shots of liquor per week     Comment: occasional  . Drug Use: No  . Sexual Activity: Not on file   Other Topics Concern  . 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 Cancer Mother 63    multiple myeloma 63yo; breast 50yo  . Glaucoma Father   . Glaucoma Sister   . Heart disease Brother     thinks it was related to steroid abuse  . Diabetes Neg Hx   . Stroke Neg Hx   . Hypertension Neg Hx     Current outpatient prescriptions:ibuprofen (ADVIL,MOTRIN) 200 MG tablet, Take 600 mg by mouth every 6 (six) hours as needed for pain., Disp: , Rfl: ;  Phentermine-Topiramate (QSYMIA) 3.75-23 MG CP24, Take 1 capsule by mouth daily with breakfast., Disp: 14 capsule, Rfl: 0;  Phentermine-Topiramate (QSYMIA) 7.5-46 MG CP24, Take 1 capsule by mouth daily with breakfast., Disp: 30 capsule, Rfl: 1  No Known Allergies   Review of Systems Constitutional: -fever, -chills, +sweats, -unexpected weight change, -decreased appetite, -fatigue Allergy: -sneezing, -itching, -congestion Dermatology: -changing moles, --rash, -lumps ENT: -runny nose, -ear pain, -sore throat, -hoarseness, -sinus pain, -teeth pain, - ringing in ears, -hearing loss, -nosebleeds Cardiology: -chest pain, -palpitations, -swelling, -difficulty breathing when lying flat, -waking up short of breath Respiratory: -cough, -shortness of breath, -difficulty breathing with exercise or exertion, -wheezing, -coughing up blood Gastroenterology: -abdominal pain, -nausea, -vomiting, -diarrhea, -constipation, -blood in stool, -changes in bowel movement, -difficulty  swallowing or eating Hematology: -bleeding, -bruising  Musculoskeletal: +joint aches, -muscle aches, -joint swelling, -back pain, -neck pain, -cramping, -changes in gait Ophthalmology: denies vision changes, eye redness, itching, discharge Urology: -burning with urination, -difficulty urinating, -blood in urine, -urinary frequency, -urgency, -incontinence Neurology: -headache, -weakness, -tingling, -numbness, -memory loss, -falls,  -dizziness Psychology: -depressed mood, -agitation, -sleep problems     Objective:   Physical Exam BP 120/80  Pulse 82  Temp(Src) 98.5 F (36.9 C) (Oral)  Resp 16  Ht 5' 4.2" (1.631 m)  Wt 238 lb (107.956 kg)  BMI 40.58 kg/m2  General appearance: alert, no distress, WD/WN, obese AA female Skin: few benign scattered lesions, no obvious worrisome lesions HEENT: normocephalic, conjunctiva/corneas normal, sclerae anicteric, PERRLA, EOMi, nares patent, no discharge or erythema, pharynx normal Oral cavity: MMM, tongue normal, teeth in good repair Neck: supple, no lymphadenopathy, no thyromegaly, no masses, normal ROM, no bruits Chest: non tender, normal shape and expansion Heart: RRR, normal S1, S2, no murmurs Lungs: CTA bilaterally, no wheezes, rhonchi, or rales Abdomen: +bs, soft, non tender, non distended, no masses, no hepatomegaly, no splenomegaly, no bruits Back: non tender, normal ROM, no scoliosis Musculoskeletal: upper extremities non tender, no obvious deformity, normal ROM throughout, lower extremities non tender, no obvious deformity, normal ROM throughout Extremities: no edema, no cyanosis, no clubbing Pulses: 2+ symmetric, upper and lower extremities, normal cap refill Neurological: alert, oriented x 3, CN2-12 intact, strength normal upper extremities and lower extremities, sensation normal throughout, DTRs 2+ throughout, no cerebellar signs, gait normal Psychiatric: normal affect, behavior normal, pleasant  Breast/gyn/rectal - deferred to gynecology   Assessment and Plan :    Encounter Diagnoses  Name Primary?  . Routine general medical examination at a health care facility Yes  . Obesity, unspecified   . Screening for breast cancer     Physical exam - discussed healthy lifestyle, diet, exercise, preventative care, vaccinations, and addressed their concerns.  Handout given.  Advised yearly dental and eye doctor visit.  We will refer to gynecology for routine  gynecological screening/care.  She will go for mammogram.  Labs today for lipids,and I reviewed her recent lab panel per GI.  She is awaiting colonoscopy procedure, coming up this month.   Obesity - discussed strategies to lose weight.  Advised diet tracking x 1 wk using smart phone app, discussed frequent small portioned meals, discussed foods to include, foods to avoid, advised routine exercise, increased water intake, discussed risks/benefits of medication.  Begin Qsymia.    Follow-up 58mo

## 2013-07-07 NOTE — Patient Instructions (Addendum)
Go for mammogram. Go see eye doctor and dentist. We will refer you for gynecology screening.   We will check your cholesterol  Begin Qsymia low dose daily at breakfast x 2 weeks, then go to higher dose daily.  Limit calories to 1800 per day.  Use smart phone app to measure your current calorie intake.  Continue getting exercise.  Work on [employer] changes we discussed.  i want to see you back in 2 months regarding weight loss.   Preventative Care for Adults - Female      MAINTAIN REGULAR HEALTH EXAMS:  A routine yearly physical is a good way to check in with your primary care provider about your health and preventive screening. It is also an opportunity to share updates about your health and any concerns you have, and receive a thorough all-over exam.   Most health insurance companies pay for at least some preventative services.  Check with your health plan for specific coverages.  WHAT PREVENTATIVE SERVICES DO WOMEN NEED?  Adult women should have their weight and blood pressure checked regularly.   Women age 35 and older should have their cholesterol levels checked regularly.  Women should be screened for cervical cancer with a Pap smear and pelvic exam beginning at either age 40, or 3 years after they become sexually activity.    Breast cancer screening generally begins at age 46 with a mammogram and breast exam by your primary care provider.    Beginning at age 24 and continuing to age 21, women should be screened for colorectal cancer.  Certain people may need continued testing until age 25.  Updating vaccinations is part of preventative care.  Vaccinations help protect against diseases such as the flu.  Osteoporosis is a disease in which the bones lose minerals and strength as we age. Women ages 50 and over should discuss this with their caregivers, as should women after menopause who have other risk factors.  Lab tests are generally done as part of preventative care to  screen for anemia and blood disorders, to screen for problems with the kidneys and liver, to screen for bladder problems, to check blood sugar, and to check your cholesterol level.  Preventative services generally include counseling about diet, exercise, avoiding tobacco, drugs, excessive alcohol consumption, and sexually transmitted infections.    GENERAL RECOMMENDATIONS FOR GOOD HEALTH:  Healthy diet:  Eat a variety of foods, including fruit, vegetables, animal or vegetable protein, such as meat, fish, chicken, and eggs, or beans, lentils, tofu, and grains, such as rice.  Drink plenty of water daily.  Decrease saturated fat in the diet, avoid lots of red meat, processed foods, sweets, fast foods, and fried foods.  Exercise:  Aerobic exercise helps maintain good heart health. At least 30-40 minutes of moderate-intensity exercise is recommended. For example, a brisk walk that increases your heart rate and breathing. This should be done on most days of the week.   Find a type of exercise or a variety of exercises that you enjoy so that it becomes a part of your daily life.  Examples are running, walking, swimming, water aerobics, and biking.  For motivation and support, explore group exercise such as aerobic class, spin class, Zumba, Yoga,or  martial arts, etc.    Set exercise goals for yourself, such as a certain weight goal, walk or run in a race such as a 5k walk/run.  Speak to your primary care provider about exercise goals.  Disease prevention:  If you smoke  or chew tobacco, find out from your caregiver how to quit. It can literally save your life, no matter how long you have been a tobacco user. If you do not use tobacco, never begin.   Maintain a healthy diet and normal weight. Increased weight leads to problems with blood pressure and diabetes.   The Body Mass Index or BMI is a way of measuring how much of your body is fat. Having a BMI above 27 increases the risk of heart disease,  diabetes, hypertension, stroke and other problems related to obesity. Your caregiver can help determine your BMI and based on it develop an exercise and dietary program to help you achieve or maintain this important measurement at a healthful level.  High blood pressure causes heart and blood vessel problems.  Persistent high blood pressure should be treated with medicine if weight loss and exercise do not work.   Fat and cholesterol leaves deposits in your arteries that can block them. This causes heart disease and vessel disease elsewhere in your body.  If your cholesterol is found to be high, or if you have heart disease or certain other medical conditions, then you may need to have your cholesterol monitored frequently and be treated with medication.   Ask if you should have a cardiac stress test if your history suggests this. A stress test is a test done on a treadmill that looks for heart disease. This test can find disease prior to there being a problem.  Menopause can be associated with physical symptoms and risks. Hormone replacement therapy is available to decrease these. You should talk to your caregiver about whether starting or continuing to take hormones is right for you.   Osteoporosis is a disease in which the bones lose minerals and strength as we age. This can result in serious bone fractures. Risk of osteoporosis can be identified using a bone density scan. Women ages 4165 and over should discuss this with their caregivers, as should women after menopause who have other risk factors. Ask your caregiver whether you should be taking a calcium supplement and Vitamin D, to reduce the rate of osteoporosis.   Avoid drinking alcohol in excess (more than two drinks per day).  Avoid use of street drugs. Do not share needles with anyone. Ask for professional help if you need assistance or instructions on stopping the use of alcohol, cigarettes, and/or drugs.  Brush your teeth twice a day with  fluoride toothpaste, and floss once a day. Good oral hygiene prevents tooth decay and gum disease. The problems can be painful, unattractive, and can cause other health problems. Visit your dentist for a routine oral and dental check up and preventive care every 6-12 months.   Look at your skin regularly.  Use a mirror to look at your back. Notify your caregivers of changes in moles, especially if there are changes in shapes, colors, a size larger than a pencil eraser, an irregular border, or development of new moles.  Safety:  Use seatbelts 100% of the time, whether driving or as a passenger.  Use safety devices such as hearing protection if you work in environments with loud noise or significant background noise.  Use safety glasses when doing any work that could send debris in to the eyes.  Use a helmet if you ride a bike or motorcycle.  Use appropriate safety gear for contact sports.  Talk to your caregiver about gun safety.  Use sunscreen with a SPF (or skin protection factor) of  15 or greater.  Lighter skinned people are at a greater risk of skin cancer. Don't forget to also wear sunglasses in order to protect your eyes from too much damaging sunlight. Damaging sunlight can accelerate cataract formation.   Practice safe sex. Use condoms. Condoms are used for birth control and to help reduce the spread of sexually transmitted infections (or STIs).  Some of the STIs are gonorrhea (the clap), chlamydia, syphilis, trichomonas, herpes, HPV (human papilloma virus) and HIV (human immunodeficiency virus) which causes AIDS. The herpes, HIV and HPV are viral illnesses that have no cure. These can result in disability, cancer and death.   Keep carbon monoxide and smoke detectors in your home functioning at all times. Change the batteries every 6 months or use a model that plugs into the wall.   Vaccinations:  Stay up to date with your tetanus shots and other required immunizations. You should have a  booster for tetanus every 10 years. Be sure to get your flu shot every year, since 5%-20% of the [place] population comes down with the flu. The flu vaccine changes each year, so being vaccinated once is not enough. Get your shot in the fall, before the flu season peaks.   Other vaccines to consider:  Human Papilloma Virus or HPV causes cancer of the cervix, and other infections that can be transmitted from person to person. There is a vaccine for HPV, and females should get immunized between the ages of 55 and 44. It requires a series of 3 shots.   Pneumococcal vaccine to protect against certain types of pneumonia.  This is normally recommended for adults age 31 or older.  However, adults younger than 50 years old with certain underlying conditions such as diabetes, heart or lung disease should also receive the vaccine.  Shingles vaccine to protect against Varicella Zoster if you are older than age 45, or younger than 50 years old with certain underlying illness.  Hepatitis A vaccine to protect against a form of infection of the liver by a virus acquired from food.  Hepatitis B vaccine to protect against a form of infection of the liver by a virus acquired from blood or body fluids, particularly if you work in health care.  If you plan to travel internationally, check with your local health department for specific vaccination recommendations.  Cancer Screening:  Breast cancer screening is essential to preventive care for women. All women age 41 and older should perform a breast self-exam every month. At age 39 and older, women should have their caregiver complete a breast exam each year. Women at ages 22 and older should have a mammogram (x-ray film) of the breasts. Your caregiver can discuss how often you need mammograms.    Cervical cancer screening includes taking a Pap smear (sample of cells examined under a microscope) from the cervix (end of the uterus). It also includes testing for HPV (Human  Papilloma Virus, which can cause cervical cancer). Screening and a pelvic exam should begin at age 71, or 3 years after a woman becomes sexually active. Screening should occur every year, with a Pap smear but no HPV testing, up to age 31. After age 29, you should have a Pap smear every 3 years with HPV testing, if no HPV was found previously.   Most routine colon cancer screening begins at the age of 52. On a yearly basis, doctors may provide special easy to use take-home tests to check for hidden blood in the stool. Sigmoidoscopy or  colonoscopy can detect the earliest forms of colon cancer and is life saving. These tests use a small camera at the end of a tube to directly examine the colon. Speak to your caregiver about this at age 27, when routine screening begins (and is repeated every 5 years unless early forms of pre-cancerous polyps or small growths are found).

## 2013-08-10 ENCOUNTER — Other Ambulatory Visit: Payer: Self-pay | Admitting: Medical

## 2013-08-10 ENCOUNTER — Telehealth: Payer: Self-pay | Admitting: Medical

## 2013-08-10 MED ORDER — CYCLOBENZAPRINE HCL 10 MG PO TABS: 10.0000 mg | ORAL_TABLET | Freq: Three times a day (TID) | ORAL | Status: DC | PRN

## 2013-08-10 NOTE — Telephone Encounter (Signed)
   Please call   Pt had colonoscopy today and was told she can no longer take ibuprofen, due to the think she could have crohn's disease. She takes that for her sciatica and now needs to know what she can take, she is having pain

## 2013-08-10 NOTE — Telephone Encounter (Signed)
Tylenol 500-650 mg OTC, otherwise if giving lots of problems, consider recheck.

## 2013-08-10 NOTE — Telephone Encounter (Signed)
Pt is still in pain pt wants to know if you can send in fexeril [facility]

## 2013-08-11 NOTE — Telephone Encounter (Signed)
Pt called back and stated that otc med is not helping at all and she can only take the flexeril at night. She is requesting something else be called in for her. [facility]. You can reach pt at [phone]

## 2013-08-12 ENCOUNTER — Other Ambulatory Visit: Payer: Self-pay | Admitting: Medical

## 2013-08-12 MED ORDER — TRAMADOL HCL 50 MG PO TABS: 50.0000 mg | ORAL_TABLET | Freq: Three times a day (TID) | ORAL | Status: DC | PRN

## 2013-08-12 NOTE — Telephone Encounter (Signed)
Phone in the tramadol I ordered, make sure she is taking the Qsymia, and recheck on back/weight loss in 3-4 wk

## 2013-08-12 NOTE — Telephone Encounter (Signed)
Patient is not taking the Qysmia currently because she had a colonoscopy and they told her not to take the medication. She said she will start back on it soon, and she will follow up as well. CLS  I called out Tramadol to her pharmacy per Crosby Oyster PA-C. CLS

## 2013-08-17 ENCOUNTER — Encounter: Payer: Self-pay | Admitting: Medical

## 2013-09-14 ENCOUNTER — Telehealth: Payer: Self-pay | Admitting: Medical

## 2013-09-14 NOTE — Telephone Encounter (Signed)
Please call, patient states she is still having leg pain and wants to have a full body scan to see what is causing the pain

## 2013-09-14 NOTE — Telephone Encounter (Signed)
i have seen her for back pain, sacroiliac pain, and bursitis.  I have reviewed 2012 back xray.  At this point, either have her return to discuss her pain and next steps, or if specifically hip pain, we can send first for hip xray and pelvis xrays.

## 2013-09-15 NOTE — Telephone Encounter (Signed)
Patient is aware of what Kristian Covey PA-C recommends. CLS

## 2014-01-17 ENCOUNTER — Emergency Department (HOSPITAL_BASED_OUTPATIENT_CLINIC_OR_DEPARTMENT_OTHER): Payer: BC Managed Care – PPO

## 2014-01-17 ENCOUNTER — Emergency Department (HOSPITAL_BASED_OUTPATIENT_CLINIC_OR_DEPARTMENT_OTHER)
Admission: EM | Admit: 2014-01-17 | Discharge: 2014-01-17 | Disposition: A | Discharge status: Home / Self Care | Payer: BC Managed Care – PPO | Attending: Emergency Medicine | Admitting: Emergency Medicine

## 2014-01-17 ENCOUNTER — Encounter (HOSPITAL_BASED_OUTPATIENT_CLINIC_OR_DEPARTMENT_OTHER): Payer: Self-pay | Admitting: Emergency Medicine

## 2014-01-17 DIAGNOSIS — Z79899 Other long term (current) drug therapy: Secondary | ICD-10-CM | POA: Insufficient documentation

## 2014-01-17 DIAGNOSIS — H81399 Other peripheral vertigo, unspecified ear: Secondary | ICD-10-CM | POA: Insufficient documentation

## 2014-01-17 DIAGNOSIS — R059 Cough, unspecified: Secondary | ICD-10-CM | POA: Insufficient documentation

## 2014-01-17 DIAGNOSIS — R0602 Shortness of breath: Secondary | ICD-10-CM | POA: Insufficient documentation

## 2014-01-17 DIAGNOSIS — Z8719 Personal history of other diseases of the digestive system: Secondary | ICD-10-CM | POA: Insufficient documentation

## 2014-01-17 DIAGNOSIS — H5500 Unspecified nystagmus: Secondary | ICD-10-CM | POA: Insufficient documentation

## 2014-01-17 DIAGNOSIS — Z8709 Personal history of other diseases of the respiratory system: Secondary | ICD-10-CM | POA: Insufficient documentation

## 2014-01-17 DIAGNOSIS — R110 Nausea: Secondary | ICD-10-CM | POA: Insufficient documentation

## 2014-01-17 DIAGNOSIS — E669 Obesity, unspecified: Secondary | ICD-10-CM | POA: Insufficient documentation

## 2014-01-17 DIAGNOSIS — Z8742 Personal history of other diseases of the female genital tract: Secondary | ICD-10-CM | POA: Insufficient documentation

## 2014-01-17 DIAGNOSIS — R05 Cough: Secondary | ICD-10-CM | POA: Insufficient documentation

## 2014-01-17 HISTORY — DX: Noninfective gastroenteritis and colitis, unspecified: K52.9

## 2014-01-17 LAB — CBC WITH DIFFERENTIAL/PLATELET
Basophils Absolute: 0 10*3/uL (ref 0.0–0.1)
Basophils Relative: 0 % (ref 0–1)
Eosinophils Absolute: 0.2 10*3/uL (ref 0.0–0.7)
Eosinophils Relative: 5 % (ref 0–5)
HCT: 31.1 % — ABNORMAL LOW (ref 36.0–46.0)
Hemoglobin: 10.6 g/dL — ABNORMAL LOW (ref 12.0–15.0)
LYMPHS ABS: 1.3 10*3/uL (ref 0.7–4.0)
LYMPHS PCT: 29 % (ref 12–46)
MCH: 27 pg (ref 26.0–34.0)
MCHC: 34.1 g/dL (ref 30.0–36.0)
MCV: 79.1 fL (ref 78.0–100.0)
Monocytes Absolute: 0.8 10*3/uL (ref 0.1–1.0)
Monocytes Relative: 18 % — ABNORMAL HIGH (ref 3–12)
NEUTROS PCT: 49 % (ref 43–77)
Neutro Abs: 2.2 10*3/uL (ref 1.7–7.7)
PLATELETS: 175 10*3/uL (ref 150–400)
RBC: 3.93 MIL/uL (ref 3.87–5.11)
RDW: 14.9 % (ref 11.5–15.5)
WBC: 4.5 10*3/uL (ref 4.0–10.5)

## 2014-01-17 LAB — BASIC METABOLIC PANEL
BUN: 12 mg/dL (ref 6–23)
CO2: 25 meq/L (ref 19–32)
Calcium: 9.1 mg/dL (ref 8.4–10.5)
Chloride: 105 mEq/L (ref 96–112)
Creatinine, Ser: 0.6 mg/dL (ref 0.50–1.10)
GFR calc Af Amer: >90 mL/min (ref >90)
GFR calc non Af Amer: >90 mL/min (ref >90)
GLUCOSE: 127 mg/dL — AB (ref 70–99)
POTASSIUM: 3.5 meq/L — AB (ref 3.7–5.3)
SODIUM: 143 meq/L (ref 137–147)

## 2014-01-17 LAB — TROPONIN I: Troponin I: <0.3 ng/mL (ref <0.30)

## 2014-01-17 MED ORDER — LORAZEPAM 2 MG/ML IJ SOLN: 1.0000 mg | Freq: Once | INTRAMUSCULAR | Status: DC

## 2014-01-17 MED ORDER — DIAZEPAM 10 MG PO TABS: 5.0000 mg | ORAL_TABLET | Freq: Three times a day (TID) | ORAL | Status: DC | PRN

## 2014-01-17 MED ORDER — ONDANSETRON HCL 4 MG PO TABS: 4.0000 mg | ORAL_TABLET | Freq: Four times a day (QID) | ORAL | Status: DC

## 2014-01-17 MED ORDER — ONDANSETRON HCL 4 MG/2ML IJ SOLN
4.0000 mg | Freq: Once | INTRAMUSCULAR | Status: AC
  Administered 2014-01-17: 4 mg via INTRAVENOUS
  Filled 2014-01-17: qty 2

## 2014-01-17 MED ORDER — DIAZEPAM 5 MG PO TABS
10.0000 mg | ORAL_TABLET | Freq: Once | ORAL | Status: AC
  Administered 2014-01-17: 10 mg via ORAL
  Filled 2014-01-17: qty 2

## 2014-01-17 NOTE — ED Notes (Signed)
Pt. Ambulated around the dept, tolerated very well, denies dizziness/nausea

## 2014-01-17 NOTE — ED Provider Notes (Addendum)
CSN: [id number]     Arrival date & time 01/17/14  0830 History   First MD Initiated Contact with Patient 01/17/14 [phone]     Chief Complaint  Patient presents with  . Nausea  . Dizziness     (Consider location/radiation/quality/duration/timing/severity/associated sxs/prior Treatment) Patient is a 53 y.o. female presenting with dizziness. The history is provided by the patient.  Dizziness Quality:  Room spinning Severity:  Moderate Onset quality:  Sudden Duration:  2 hours Timing:  Constant Progression:  Unchanged Chronicity:  Recurrent Context: eye movement, head movement and standing up   Context comment:  Woke up this morning feeling dizzy and breathing fast and hard Relieved by:  Being still Worsened by:  Eye movement, closing eyes, movement, turning head and standing up Associated symptoms: nausea and shortness of breath   Associated symptoms: no chest pain, no diarrhea, no headaches, no palpitations, no syncope, no tinnitus, no vision changes, no vomiting and no weakness   Associated symptoms comment:  When she woke up she was breathing fast and felt SOB and her hands were tingling Risk factors: hx of vertigo   Risk factors comment:  10 years ago had a hx of vertigo for 3 months which she states is similar to this but worse back then   Past Medical History  Diagnosis Date  . Nocturnal enuresis     history of, resolved  . Hip joint pain   . Obesity   . Uterine fibroid     s/p hysterectomy  . Urge incontinence   . Wears glasses   . Influenza vaccine refused 9/14  . Colitis    Past Surgical History  Procedure Laterality Date  . Cesarean section    . Wisdom tooth extraction    . Colonoscopy      pending 08/10/13, Dr. Collene Mares  . Abdominal hysterectomy      due to fibroids; partial; has both ovaries   Family History  Problem Relation Age of Onset  . Cancer Mother 42    multiple myeloma 52yo; breast 51yo  . Glaucoma Father   . Glaucoma Sister   . Heart disease  Brother     thinks it was related to steroid abuse  . Diabetes Neg Hx   . Stroke Neg Hx   . Hypertension Neg Hx    History  Substance Use Topics  . Smoking status: Never Smoker   . Smokeless tobacco: Never Used  . Alcohol Use: 12.0 oz/week    20 Shots of liquor per week     Comment: occasional   OB History   Grav Para Term Preterm Abortions TAB SAB Ect Mult Living                 Review of Systems  Constitutional: Negative for fever.  HENT: Negative for tinnitus.   Respiratory: Positive for cough and shortness of breath.        Last week had a URI and still coughing but o/w sx have improved  Cardiovascular: Negative for chest pain, palpitations and syncope.  Gastrointestinal: Positive for nausea. Negative for vomiting and diarrhea.  Musculoskeletal: Negative for neck pain.  Neurological: Positive for dizziness. Negative for headaches.  All other systems reviewed and are negative.      Allergies  Review of patient's allergies indicates no known allergies.  Home Medications   Current Outpatient Rx  Name  Route  Sig  Dispense  Refill  . cyclobenzaprine (FLEXERIL) 10 MG tablet   Oral   Take 1  tablet (10 mg total) by mouth 3 (three) times daily as needed for muscle spasms.   30 tablet   0   . Phentermine-Topiramate (QSYMIA) 3.75-23 MG CP24   Oral   Take 1 capsule by mouth daily with breakfast.   14 capsule   0   . Phentermine-Topiramate (QSYMIA) 7.5-46 MG CP24   Oral   Take 1 capsule by mouth daily with breakfast.   30 capsule   1   . traMADol (ULTRAM) 50 MG tablet   Oral   Take 1 tablet (50 mg total) by mouth every 8 (eight) hours as needed for pain.   30 tablet   0    There were no vitals taken for this visit. Physical Exam  Nursing note and vitals reviewed. Constitutional: She is oriented to person, place, and time. She appears well-developed and well-nourished. No distress.  HENT:  Head: Normocephalic and atraumatic.  Eyes: Conjunctivae and EOM  are normal. Pupils are equal, round, and reactive to light.  Mild nystagmus when looking to the left and right  Neck: No spinous process tenderness and no muscular tenderness present. Carotid bruit is not present.  Cardiovascular: Normal rate, regular rhythm, normal heart sounds and intact distal pulses.  Exam reveals no friction rub.   No murmur heard. Pulmonary/Chest: Effort normal and breath sounds normal. She has no wheezes. She has no rales.  Abdominal: Soft. Bowel sounds are normal. She exhibits no distension. There is no tenderness. There is no rebound and no guarding.  Musculoskeletal: Normal range of motion. She exhibits no tenderness.  No edema  Neurological: She is alert and oriented to person, place, and time. She has normal strength. No cranial nerve deficit or sensory deficit. Coordination normal.  Normal heel to shin and finger to nose testing  Skin: Skin is warm and dry. No rash noted.  Psychiatric: She has a normal mood and affect. Her behavior is normal.    ED Course  Procedures (including critical care time) Labs Review Labs Reviewed  CBC WITH DIFFERENTIAL - Abnormal; Notable for the following:    Hemoglobin 10.6 (*)    HCT 31.1 (*)    Monocytes Relative 18 (*)    All other components within normal limits  BASIC METABOLIC PANEL - Abnormal; Notable for the following:    Potassium 3.5 (*)    Glucose, Bld 127 (*)    All other components within normal limits  TROPONIN I   Imaging Review Dg Chest 2 View  01/17/2014   ADDENDUM REPORT: 01/17/2014 09:46  ADDENDUM: With deeper inspiration there is slight decrease conspicuity of the interstitial markings. No evidence of acute cardiopulmonary disease.   Electronically Signed   By: Margaree Mackintosh M.D.   On: 01/17/2014 09:46   01/17/2014   CLINICAL DATA:  sob  EXAM: CHEST  2 VIEW  COMPARISON:  DG CHEST 2 VIEW dated 11/09/2008  FINDINGS: Low lung volumes. Cardiac silhouette moderately enlarged. There is mild prominence of  interstitial markings. No focal regions of consolidation or focal infiltrates. Degenerative changes appreciated within the thoracic spine.  IMPRESSION: Mild interstitial prominence differential considerations infectious or inflammatory interstitial infiltrate. Pulmonary vascular congestion also diagnostic consideration. Repeat two-view chest with deeper inspiration recommended.  Electronically Signed: By: Margaree Mackintosh M.D. On: 01/17/2014 08:53     EKG Interpretation   Date/Time:  Sunday January 17 2014 08:36:53 EDT Ventricular Rate:  80 PR Interval:  148 QRS Duration: 82 QT Interval:  346 QTC Calculation: 399 R Axis:  36 Text Interpretation:  Normal sinus rhythm Nonspecific T wave abnormality  No significant change since last tracing Confirmed by [facility]  MD,  Kayline Sheer ([id number]) on 01/17/2014 8:42:39 AM      MDM   Final diagnoses:  Peripheral vertigo   Pt with sx most consistent with peripheral vertigo.  No systemic or infectious sx.  Normal neuro exam without weakness, ataxia or cerebellar findings on exam.  Normal vision.  Sx are reproducible with movement of the head and attempting to walk.  No hx of Stroke and low likelihood.  No risk factors and normal VS.  Pt also states this am when she woke up feeling dizzy she had tingling in her hands and she was hyperventilating and breathing funny but no true SOB.  Will check EKG, CXR and CBC, BMP to ensure no acute findings.  Pt with hx of vertigo about 10 years ago which she states feels the same as this but back then it was worse. Will treat for peripheral vertigo with valium.  She received zofran by EMS.  10:39 AM Labs and imaging wnl without acute findings.  EKG unchanged.  After valium pt feeling better.  Will ambulate  10:46 AM Pt was able to ambulate without difficulty and no ataxia.  Sx were minimal.  Blanchie Dessert, MD 01/17/14 1046  Blanchie Dessert, MD 01/17/14 1048

## 2014-01-17 NOTE — Discharge Instructions (Signed)

## 2014-01-17 NOTE — ED Notes (Addendum)
Patient woke up this morning with nausea and dizziness, Hx of vertigo, no neuro deficits. 4mg  Zofran given by EMS, nausea subsided, cbg - 92 measured by EMS

## 2014-01-25 ENCOUNTER — Other Ambulatory Visit: Payer: Self-pay

## 2014-01-25 DIAGNOSIS — Z1231 Encounter for screening mammogram for malignant neoplasm of breast: Secondary | ICD-10-CM

## 2014-02-02 ENCOUNTER — Encounter (INDEPENDENT_AMBULATORY_CARE_PROVIDER_SITE_OTHER): Payer: Self-pay

## 2014-02-02 ENCOUNTER — Ambulatory Visit
Admission: RE | Admit: 2014-02-02 | Discharge: 2014-02-02 | Disposition: A | Discharge status: Home / Self Care | Payer: BC Managed Care – PPO | Source: Ambulatory Visit

## 2014-02-02 DIAGNOSIS — Z1231 Encounter for screening mammogram for malignant neoplasm of breast: Secondary | ICD-10-CM

## 2014-04-27 ENCOUNTER — Encounter: Payer: Self-pay | Admitting: Pulmonary Disease

## 2014-05-10 ENCOUNTER — Encounter: Payer: Self-pay | Admitting: Internal Medicine

## 2014-08-26 ENCOUNTER — Encounter: Payer: Self-pay | Admitting: Medical

## 2014-08-26 ENCOUNTER — Telehealth: Payer: Self-pay | Admitting: Medical

## 2014-08-26 ENCOUNTER — Ambulatory Visit (INDEPENDENT_AMBULATORY_CARE_PROVIDER_SITE_OTHER): Payer: BC Managed Care – PPO | Admitting: Medical

## 2014-08-26 VITALS — BP 150/90 | HR 88 | Temp 98.0°F | Resp 16 | Wt 234.0 lb

## 2014-08-26 DIAGNOSIS — M436 Torticollis: Secondary | ICD-10-CM

## 2014-08-26 MED ORDER — CYCLOBENZAPRINE HCL 10 MG PO TABS: 10.0000 mg | ORAL_TABLET | Freq: Every evening | ORAL | Status: DC | PRN

## 2014-08-26 NOTE — Telephone Encounter (Addendum)
Sonya White- call and have ortho/Dr. Rush Farmer send over recent notes from her surgery.  After looking over her chart when she had left also thought that if she would like a soft neck brace to use periodically but not all day long, call this out.  The intent would be to use this for a few hours per day for the next 3-4 days and then stop this  If not basically back to normal within a week let me know as we would then pursue x-ray and possibly physical therapy

## 2014-08-26 NOTE — Patient Instructions (Signed)
  Thank you for giving me the opportunity to serve you today.    Your diagnosis today includes: Encounter Diagnosis  Name Primary?  . Torticollis Yes     Specific recommendations today include:  For the next 5-7 days...  Use daily stretching routine  Heat to the neck  Consider massage  Use OTC Aleve, 1-2 tablets twice daily with food  Flexeril muscle relaxer at bedtime or up to twice daily on days you don't work  And if not much improved within a week, let us know  Torticollis, Acute You have suddenly (acutely) developed a twisted neck (torticollis). This is usually a self-limited condition. CAUSES  Acute torticollis may be caused by malposition, trauma or infection. Most commonly, acute torticollis is caused by sleeping in an awkward position. Torticollis may also be caused by the flexion, extension or twisting of the neck muscles beyond their normal position. Sometimes, the exact cause may not be known. SYMPTOMS  Usually, there is pain and limited movement of the neck. Your neck may twist to one side. DIAGNOSIS  The diagnosis is often made by physical examination. X-rays, CT scans or MRIs may be done if there is a history of trauma or concern of infection. TREATMENT  For a common, stiff neck that develops during sleep, treatment is focused on relaxing the contracted neck muscle. Medications (including shots) may be used to treat the problem. Most cases resolve in several days. Torticollis usually responds to conservative physical therapy. If left untreated, the shortened and spastic neck muscle can cause deformities in the face and neck. Rarely, surgery is required. HOME CARE INSTRUCTIONS   Use over-the-counter and prescription medications as directed by your caregiver.  Do stretching exercises and massage the neck as directed by your caregiver.  Follow up with physical therapy if needed and as directed by your caregiver. SEEK IMMEDIATE MEDICAL CARE IF:   You develop  difficulty breathing or noisy breathing (stridor).  You drool, develop trouble swallowing or have pain with swallowing.  You develop numbness or weakness in the hands or feet.  You have changes in speech or vision.  You have problems with urination or bowel movements.  You have difficulty walking.  You have a fever.  You have increased pain. MAKE SURE YOU:   Understand these instructions.  Will watch your condition.  Will get help right away if you are not doing well or get worse. Document Released: 09/28/2000 Document Revised: 12/24/2011 Document Reviewed: 11/09/2009 [facility] Patient Information 2015 [place], [place]. This information is not intended to replace advice given to you by your health care provider. Make sure you discuss any questions you have with your health care provider.

## 2014-08-26 NOTE — Progress Notes (Signed)
Subjective: Here for neck complaint.  She notes having stiff neck, sore neck with decreased range of motion about 1-1/2 months ago that lasted for several weeks and resolved and then now for the last few weeks seems to flare up again.  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 She has a get up and out of the truck throughout the day feels like she has to hold head due to pain.  Using Tylenol over-the-counter. She has recent started normal weight lifting, but no other injury, trauma, fall, no arm pain, no numbness tingling or weakness, history of neck surgery, no prior neck x-ray.  No other aggravating or relieving factors. No other complaint.  Objective: Gen.: Well-developed, well-nourished, no acute distress Skin: Unremarkable Neck: Tender bilateral neck muscles, spasm of muscles, decreased range of motion in all direction approximately 70-75% of normal, no mass, no thyromegaly or lymphadenopathy Upper back nontender Arms normal range of motion, no deformity Extremities normal strength, sensation, DTRs  Assessment: Encounter Diagnosis  Name Primary?  . Torticollis Yes    Plan: Discussed symptoms, exam findings, diagnosis, begin Aleve over-the-counter 2 tablets twice a day for the next 5-7 days, Flexeril when necessary discussed sedation with Flexeril, benefits and risk of Flexeril,, advised heat topically, consider massage, and if not much improved within a week and let me know  This note was dictated using Systems analyst voice recognition software.  Any spelling, word, or phrase errors above were unintentional.

## 2014-08-30 NOTE — Telephone Encounter (Signed)
Called Dr. Ninfa Linden office and they will fax over recent notes from surgery done in June and they will fax over notes for shane to look at. Called pt and left message for her to call me back

## 2014-08-31 NOTE — Telephone Encounter (Signed)
Patient is already aware of [facility] note and I fax over a Rx for the patient to get a soft neck brace.

## 2014-09-22 ENCOUNTER — Other Ambulatory Visit: Payer: Self-pay | Admitting: Medical

## 2014-09-23 NOTE — Telephone Encounter (Signed)
LM for PT to CB

## 2014-09-27 NOTE — Telephone Encounter (Signed)
i am not sure this was every sent to you. Is this okay to refill?

## 2015-01-28 ENCOUNTER — Emergency Department (HOSPITAL_BASED_OUTPATIENT_CLINIC_OR_DEPARTMENT_OTHER)
Admission: EM | Admit: 2015-01-28 | Discharge: 2015-01-28 | Disposition: A | Discharge status: Home / Self Care | Payer: BLUE CROSS/BLUE SHIELD | Attending: Emergency Medicine | Admitting: Emergency Medicine

## 2015-01-28 ENCOUNTER — Emergency Department (HOSPITAL_BASED_OUTPATIENT_CLINIC_OR_DEPARTMENT_OTHER): Payer: BLUE CROSS/BLUE SHIELD

## 2015-01-28 ENCOUNTER — Encounter (HOSPITAL_BASED_OUTPATIENT_CLINIC_OR_DEPARTMENT_OTHER): Payer: Self-pay | Admitting: *Deleted

## 2015-01-28 DIAGNOSIS — E669 Obesity, unspecified: Secondary | ICD-10-CM | POA: Diagnosis not present

## 2015-01-28 DIAGNOSIS — J9801 Acute bronchospasm: Secondary | ICD-10-CM

## 2015-01-28 DIAGNOSIS — R55 Syncope and collapse: Secondary | ICD-10-CM

## 2015-01-28 DIAGNOSIS — Z79899 Other long term (current) drug therapy: Secondary | ICD-10-CM | POA: Insufficient documentation

## 2015-01-28 DIAGNOSIS — Z973 Presence of spectacles and contact lenses: Secondary | ICD-10-CM | POA: Diagnosis not present

## 2015-01-28 DIAGNOSIS — J219 Acute bronchiolitis, unspecified: Secondary | ICD-10-CM | POA: Insufficient documentation

## 2015-01-28 DIAGNOSIS — Z87448 Personal history of other diseases of urinary system: Secondary | ICD-10-CM | POA: Diagnosis not present

## 2015-01-28 DIAGNOSIS — Z86018 Personal history of other benign neoplasm: Secondary | ICD-10-CM | POA: Insufficient documentation

## 2015-01-28 DIAGNOSIS — Z8719 Personal history of other diseases of the digestive system: Secondary | ICD-10-CM | POA: Diagnosis not present

## 2015-01-28 DIAGNOSIS — R0602 Shortness of breath: Secondary | ICD-10-CM

## 2015-01-28 LAB — CBG MONITORING, ED: GLUCOSE-CAPILLARY: 89 mg/dL (ref 70–99)

## 2015-01-28 LAB — CBC WITH DIFFERENTIAL/PLATELET
Basophils Absolute: 0 10*3/uL (ref 0.0–0.1)
Basophils Relative: 0 % (ref 0–1)
EOS ABS: 0.1 10*3/uL (ref 0.0–0.7)
EOS PCT: 1 % (ref 0–5)
HCT: 36.5 % (ref 36.0–46.0)
Hemoglobin: 12.1 g/dL (ref 12.0–15.0)
LYMPHS ABS: 0.7 10*3/uL (ref 0.7–4.0)
Lymphocytes Relative: 8 % — ABNORMAL LOW (ref 12–46)
MCH: 26.6 pg (ref 26.0–34.0)
MCHC: 33.2 g/dL (ref 30.0–36.0)
MCV: 80.2 fL (ref 78.0–100.0)
Monocytes Absolute: 1.8 10*3/uL — ABNORMAL HIGH (ref 0.1–1.0)
Monocytes Relative: 20 % — ABNORMAL HIGH (ref 3–12)
NEUTROS PCT: 71 % (ref 43–77)
Neutro Abs: 6.4 10*3/uL (ref 1.7–7.7)
PLATELETS: 148 10*3/uL — AB (ref 150–400)
RBC: 4.55 MIL/uL (ref 3.87–5.11)
RDW: 14.1 % (ref 11.5–15.5)
WBC: 9.1 10*3/uL (ref 4.0–10.5)

## 2015-01-28 LAB — BASIC METABOLIC PANEL
Anion gap: 8 (ref 5–15)
BUN: 6 mg/dL (ref 6–23)
CO2: 26 mmol/L (ref 19–32)
CREATININE: 0.64 mg/dL (ref 0.50–1.10)
Calcium: 8.9 mg/dL (ref 8.4–10.5)
Chloride: 104 mmol/L (ref 96–112)
GFR calc Af Amer: >90 mL/min (ref >90)
GFR calc non Af Amer: >90 mL/min (ref >90)
Glucose, Bld: 103 mg/dL — ABNORMAL HIGH (ref 70–99)
Potassium: 3.5 mmol/L (ref 3.5–5.1)
Sodium: 138 mmol/L (ref 135–145)

## 2015-01-28 MED ORDER — PREDNISONE 50 MG PO TABS
60.0000 mg | ORAL_TABLET | Freq: Once | ORAL | Status: AC
  Administered 2015-01-28: 60 mg via ORAL
  Filled 2015-01-28 (×2): qty 1

## 2015-01-28 MED ORDER — ALBUTEROL SULFATE HFA 108 (90 BASE) MCG/ACT IN AERS: 1.0000 | INHALATION_SPRAY | Freq: Four times a day (QID) | RESPIRATORY_TRACT | Status: DC | PRN

## 2015-01-28 MED ORDER — ALBUTEROL SULFATE HFA 108 (90 BASE) MCG/ACT IN AERS
2.0000 | INHALATION_SPRAY | Freq: Once | RESPIRATORY_TRACT | Status: AC
  Administered 2015-01-28: 2 via RESPIRATORY_TRACT
  Filled 2015-01-28: qty 6.7

## 2015-01-28 MED ORDER — PREDNISONE 20 MG PO TABS: 20.0000 mg | ORAL_TABLET | Freq: Every day | ORAL | Status: DC

## 2015-01-28 MED ORDER — HYDROCOD POLST-CPM POLST ER 10-8 MG/5ML PO SUER: 5.0000 mL | Freq: Two times a day (BID) | ORAL | Status: DC | PRN

## 2015-01-28 MED ORDER — ALBUTEROL SULFATE (2.5 MG/3ML) 0.083% IN NEBU
2.5000 mg | INHALATION_SOLUTION | RESPIRATORY_TRACT | Status: DC | PRN
  Administered 2015-01-28: 2.5 mg via RESPIRATORY_TRACT
  Filled 2015-01-28: qty 3

## 2015-01-28 NOTE — ED Provider Notes (Signed)
CSN: [id number]     Arrival date & time 01/28/15  0606 History   First MD Initiated Contact with Patient 01/28/15 [phone]     Chief Complaint  Patient presents with  . Loss of Consciousness      HPI  Impression presents evaluation of cough for the last 2 days and an episode of syncope this morning.  It should last 2 days she's had a cough and congestion. She is taking Mucinex last night and this morning. Has not produced any sputum with her, per no hemoptysis. No chest pain or shortness of breath.  Swallowing she was at work. She was putting on boots. She put her foot up on the edge of a bleacher and was leaning over to tie her boot. States that she was stretching down to get to her boot. She was holding her breath. States that the next thing she remembers she is laying on her back. The coworker that was nearby was speaking with her.   Had to go to the bathroom and states she had a loose stool. Has felt fine since other than continued cough. Is not lightheaded or dizzy. Does not feel short of breath. Does not have chest pain.  No history of hypertension, diabetes, hyper cholesterolemia, family history of heart disease, nonsmoker.  Past Medical History  Diagnosis Date  . Nocturnal enuresis     history of, resolved  . Hip joint pain   . Obesity   . Uterine fibroid     s/p hysterectomy  . Urge incontinence   . Wears glasses   . Influenza vaccine refused 9/14  . Colitis    Past Surgical History  Procedure Laterality Date  . Cesarean section    . Wisdom tooth extraction    . Colonoscopy      pending 08/10/13, Dr. Collene Mares  . Abdominal hysterectomy      due to fibroids; partial; has both ovaries   Family History  Problem Relation Age of Onset  . Cancer Mother 26    multiple myeloma 42yo; breast 52yo  . Glaucoma Father   . Glaucoma Sister   . Heart disease Brother     thinks it was related to steroid abuse  . Diabetes Neg Hx   . Stroke Neg Hx   . Hypertension Neg Hx     History  Substance Use Topics  . Smoking status: Never Smoker   . Smokeless tobacco: Never Used  . Alcohol Use: 12.0 oz/week    20 Shots of liquor per week     Comment: occasional   OB History    No data available     Review of Systems  Constitutional: Negative for fever, chills, diaphoresis, appetite change and fatigue.  HENT: Positive for congestion. Negative for mouth sores, sore throat and trouble swallowing.   Eyes: Negative for visual disturbance.  Respiratory: Positive for cough. Negative for chest tightness, shortness of breath and wheezing.   Cardiovascular: Negative for chest pain.  Gastrointestinal: Negative for nausea, vomiting, abdominal pain, diarrhea and abdominal distention.  Endocrine: Negative for polydipsia, polyphagia and polyuria.  Genitourinary: Negative for dysuria, frequency and hematuria.  Musculoskeletal: Negative for gait problem.  Skin: Negative for color change, pallor and rash.  Neurological: Positive for syncope. Negative for dizziness, light-headedness and headaches.  Hematological: Does not bruise/bleed easily.  Psychiatric/Behavioral: Negative for behavioral problems and confusion.      Allergies  Review of patient's allergies indicates no known allergies.  Home Medications   Prior to Admission  medications   Medication Sig Start Date End Date Taking? Authorizing Provider  albuterol (PROVENTIL HFA;VENTOLIN HFA) 108 (90 BASE) MCG/ACT inhaler Inhale 1-2 puffs into the lungs every 6 (six) hours as needed for wheezing. 01/28/15   Tanna Furry, MD  chlorpheniramine-HYDROcodone [facility] ER) 10-8 MG/5ML SUER Take 5 mLs by mouth every 12 (twelve) hours as needed for cough. 01/28/15   Tanna Furry, MD  cyclobenzaprine (FLEXERIL) 10 MG tablet TAKE 1 TABLET (10 MG TOTAL) BY MOUTH AT BEDTIME AS NEEDED FOR MUSCLE SPASMS. 09/28/14   Camelia Eng Tysinger, PA-C  predniSONE (DELTASONE) 20 MG tablet Take 1 tablet (20 mg total) by mouth daily with  breakfast. 01/28/15   Tanna Furry, MD   BP 149/89 mmHg  Pulse 98  Temp(Src) 100.3 F (37.9 C) (Oral)  Resp 22  Ht '5\' 4"'  (1.626 m)  Wt 230 lb (104.327 kg)  BMI 39.46 kg/m2  SpO2 100% Physical Exam  Constitutional: She is oriented to person, place, and time. She appears well-developed and well-nourished. No distress.  HENT:  Head: Normocephalic.  Eyes: Conjunctivae are normal. Pupils are equal, round, and reactive to light. No scleral icterus.  Neck: Normal range of motion. Neck supple. No thyromegaly present.  Cardiovascular: Normal rate and regular rhythm.  Exam reveals no gallop and no friction rub.   No murmur heard. Pulmonary/Chest: Effort normal. No respiratory distress. She has wheezes. She has no rales.  Abdominal: Soft. Bowel sounds are normal. She exhibits no distension. There is no tenderness. There is no rebound.  Musculoskeletal: Normal range of motion.  Neurological: She is alert and oriented to person, place, and time.  Skin: Skin is warm and dry. No rash noted.  Psychiatric: She has a normal mood and affect. Her behavior is normal.    ED Course  Procedures (including critical care time) Labs Review Labs Reviewed  CBC WITH DIFFERENTIAL/PLATELET - Abnormal; Notable for the following:    Platelets 148 (*)    Lymphocytes Relative 8 (*)    Monocytes Relative 20 (*)    Monocytes Absolute 1.8 (*)    All other components within normal limits  BASIC METABOLIC PANEL - Abnormal; Notable for the following:    Glucose, Bld 103 (*)    All other components within normal limits  CBG MONITORING, ED    Imaging Review Dg Chest 2 View  01/28/2015   CLINICAL DATA:  Syncopal episode this morning. Cough and chest congestion for 1 week.  EXAM: CHEST  2 VIEW  COMPARISON:  01/17/2014  FINDINGS: The heart size and mediastinal contours are within normal limits. Both lungs are clear. No evidence of pleural effusion. No mass or lymphadenopathy identified. The visualized skeletal structures  are unremarkable.  IMPRESSION: No active cardiopulmonary disease.   Electronically Signed   By: Earle Gell M.D.   On: 01/28/2015 07:37     EKG Interpretation   Date/Time:  Friday January 28 2015 06:41:26 EDT Ventricular Rate:  95 PR Interval:  138 QRS Duration: 80 QT Interval:  352 QTC Calculation: 442 R Axis:   25 Text Interpretation:  Normal sinus rhythm Nonspecific ST and T wave  abnormality Confirmed by [facility]  MD, APRIL ([id number]) on 01/28/2015  6:55:43 AM      MDM   Final diagnoses:  SOB (shortness of breath)  Bronchospasm  Stretch syncope    Patient given nebulized albuterol. Her lungs clear. States her breathing feels considerably better. She has an EKG that is normal. Normal chest x-ray. Will ask light abdomen  always, not anemic.  I think this is an episode of positional syncope likely stretch syncope or cough syncope. Low risk for this being cardiac. I discussed this with her at length. Instructed the use of an albuterol inhaler. Prescription for cough suppressant, and prednisone over the next 3 days. Recheck with any recurrence.    Tanna Furry, MD 01/28/15 [phone]

## 2015-01-28 NOTE — ED Notes (Addendum)
Was bending over and tying shoe, passed out,  Unsure how long  When came to had diarrhea and got sweaty,  States hit back of head  No knot noted,  Denies chest pain  Took muncix yesterday at 1600 and again at 0410 this am  Denies cp

## 2015-01-28 NOTE — ED Notes (Signed)
RRT at bedside instructing patient with use of inhaler.

## 2015-01-28 NOTE — ED Notes (Signed)
MD at bedside discussing results with patient 

## 2015-01-28 NOTE — ED Notes (Addendum)
Was bending over to tie shoe and passed out  unsure how long states has had cough x 3 days  Taking municex hit head  No knot on head,  States after coming to had diarrhea and sweaty   Drove self to er

## 2015-01-28 NOTE — ED Notes (Signed)
Patient called to state that the cough medication is not helping her to sleep.  States her wheezing is keeping her awake.  Would like to take tylenol pm.  Encouraged to use inhaler for the wheezes and to use relaxation to help to fall asleep.

## 2015-01-28 NOTE — Discharge Instructions (Signed)
Bronchospasm °A bronchospasm is a spasm or tightening of the airways going into the lungs. During a bronchospasm breathing becomes more difficult because the airways get smaller. When this happens there can be coughing, a whistling sound when breathing (wheezing), and difficulty breathing. Bronchospasm is often associated with asthma, but not all patients who experience a bronchospasm have asthma. °CAUSES  °A bronchospasm is caused by inflammation or irritation of the airways. The inflammation or irritation may be triggered by:  °· Allergies (such as to animals, pollen, food, or mold). Allergens that cause bronchospasm may cause wheezing immediately after exposure or many hours later.   °· Infection. Viral infections are believed to be the most common cause of bronchospasm.   °· Exercise.   °· Irritants (such as pollution, cigarette smoke, strong odors, aerosol sprays, and paint fumes).   °· Weather changes. Winds increase molds and pollens in the air. Rain refreshes the air by washing irritants out. Cold air may cause inflammation.   °· Stress and emotional upset.   °SIGNS AND SYMPTOMS  °· Wheezing.   °· Excessive nighttime coughing.   °· Frequent or severe coughing with a simple cold.   °· Chest tightness.   °· Shortness of breath.   °DIAGNOSIS  °Bronchospasm is usually diagnosed through a history and physical exam. Tests, such as chest X-rays, are sometimes done to look for other conditions. °TREATMENT  °· Inhaled medicines can be given to open up your airways and help you breathe. The medicines can be given using either an inhaler or a nebulizer machine. °· Corticosteroid medicines may be given for severe bronchospasm, usually when it is associated with asthma. °HOME CARE INSTRUCTIONS  °· Always have a plan prepared for seeking medical care. Know when to call your health care provider and local emergency services (911 in the [place] Know where you can access local emergency care. °· Only take medicines as  directed by your health care provider. °· If you were prescribed an inhaler or nebulizer machine, ask your health care provider to explain how to use it correctly. Always use a spacer with your inhaler if you were given one. °· It is necessary to remain calm during an attack. Try to relax and breathe more slowly.  °· Control your home environment in the following ways:   °¨ Change your heating and air conditioning filter at least once a month.   °¨ Limit your use of fireplaces and wood stoves. °¨ Do not smoke and do not allow smoking in your home.   °¨ Avoid exposure to perfumes and fragrances.   °¨ Get rid of pests (such as roaches and mice) and their droppings.   °¨ Throw away plants if you see mold on them.   °¨ Keep your house clean and dust free.   °¨ Replace carpet with wood, tile, or vinyl flooring. Carpet can trap dander and dust.   °¨ Use allergy-proof pillows, mattress covers, and box spring covers.   °¨ Wash bed sheets and blankets every week in hot water and dry them in a dryer.   °¨ Use blankets that are made of polyester or cotton.   °¨ Wash hands frequently. °SEEK MEDICAL CARE IF:  °· You have muscle aches.   °· You have chest pain.   °· The sputum changes from clear or white to yellow, green, gray, or bloody.   °· The sputum you cough up gets thicker.   °· There are problems that may be related to the medicine you are given, such as a rash, itching, swelling, or trouble breathing.   °SEEK IMMEDIATE MEDICAL CARE IF:  °· You have worsening wheezing and coughing even   after taking your prescribed medicines.   °· You have increased difficulty breathing.   °· You develop severe chest pain. °MAKE SURE YOU:  °· Understand these instructions. °· Will watch your condition. °· Will get help right away if you are not doing well or get worse. °Document Released: 10/04/2003 Document Revised: 10/06/2013 Document Reviewed: 03/23/2013 °ExitCare® Patient Information ©2015 [employer]. This information is not  intended to replace advice given to you by your health care provider. Make sure you discuss any questions you have with your health care provider. ° °

## 2015-01-28 NOTE — ED Notes (Signed)
cbg [id number]  

## 2015-01-29 ENCOUNTER — Other Ambulatory Visit: Payer: Self-pay | Admitting: Medical

## 2015-01-31 NOTE — Telephone Encounter (Signed)
IS THIS OKAY TO REFILL 

## 2015-02-01 ENCOUNTER — Encounter: Payer: Self-pay | Admitting: Medical

## 2015-03-07 ENCOUNTER — Other Ambulatory Visit: Payer: Self-pay | Admitting: Orthopaedic Surgery

## 2015-03-07 DIAGNOSIS — M25562 Pain in left knee: Secondary | ICD-10-CM

## 2015-03-17 ENCOUNTER — Ambulatory Visit
Admission: RE | Admit: 2015-03-17 | Discharge: 2015-03-17 | Disposition: A | Discharge status: Home / Self Care | Payer: BLUE CROSS/BLUE SHIELD | Source: Ambulatory Visit | Attending: Orthopaedic Surgery | Admitting: Orthopaedic Surgery

## 2015-03-17 DIAGNOSIS — M25562 Pain in left knee: Secondary | ICD-10-CM

## 2015-09-27 ENCOUNTER — Ambulatory Visit (INDEPENDENT_AMBULATORY_CARE_PROVIDER_SITE_OTHER): Payer: BLUE CROSS/BLUE SHIELD | Admitting: Family Medicine

## 2015-09-27 ENCOUNTER — Encounter: Payer: Self-pay | Admitting: Family Medicine

## 2015-09-27 VITALS — BP 128/84 | HR 76 | Temp 98.4°F | Ht 64.0 in | Wt 238.2 lb

## 2015-09-27 DIAGNOSIS — G5601 Carpal tunnel syndrome, right upper limb: Secondary | ICD-10-CM

## 2015-09-27 DIAGNOSIS — M25572 Pain in left ankle and joints of left foot: Secondary | ICD-10-CM | POA: Diagnosis not present

## 2015-09-27 DIAGNOSIS — B351 Tinea unguium: Secondary | ICD-10-CM

## 2015-09-27 MED ORDER — TERBINAFINE HCL 250 MG PO TABS: 250.0000 mg | ORAL_TABLET | Freq: Every day | ORAL | Status: DC

## 2015-09-27 NOTE — Progress Notes (Signed)
   Subjective:    Patient ID: Sonya White, female    DOB: 1960/10/30, 54 y.o.   MRN: [id number]  HPI She complains of a two-week history of right wrist and hand numbness. 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 It is interfering with your shifting gears. She also notes pain at night that wakes her up. She feels that none feeling her third and fourth fingers. She also complains of left medial ankle tenderness. She states that this symptom goes away when she takes her shoes off while driving. After she is up and walking she has less difficulty with this.   Review of Systems     Objective:   Physical Exam Exam of the right wrist shows good motion and normal strength. Tinel's and Phalen's test was positive. No atrophy noted. Exam of the left foot shows thickening and dark dark pigment to her nails. Full motion of the ankle. No tenderness over the medial malleolus or over the tendons. Negative Tinel's sign.       Assessment & Plan:  Onychomycosis - Plan: terbinafine (LAMISIL) 250 MG tablet  Carpal tunnel syndrome of right wrist  Pain in joint, ankle and foot, left  she is to use wrist splints for the carpal tunnel especially while driving and at night. If she continues have difficulty. I discussed possible injection. No particular therapy for the ankle since it most likely is a compression issue with her shoe. Discussed treatment of her onychomycosis and will place her on Lamisil. She is to return here in 2 months for recheck. Discussed possible liver involvement and will therefore check her numbers in 2 months. Subtle explained the fact that we can potentially help this but not necessarily cure her forever.

## 2015-09-27 NOTE — Patient Instructions (Signed)
Use the wrist splint at night and while working and if you have more trouble come in

## 2015-10-13 ENCOUNTER — Telehealth: Payer: Self-pay | Admitting: Family Medicine

## 2015-10-13 MED ORDER — DICLOFENAC SODIUM 75 MG PO TBEC: 75.0000 mg | DELAYED_RELEASE_TABLET | Freq: Two times a day (BID) | ORAL | Status: DC

## 2015-10-13 NOTE — Telephone Encounter (Signed)
Let her know that I called the medication and and let me know how it works

## 2015-10-13 NOTE — Telephone Encounter (Signed)
Pt aware and will call back with how it works

## 2015-10-13 NOTE — Telephone Encounter (Signed)
Pt called and was requesting something for pain said her wrist is still giving her problems and is in a lot of pain, she has been taking ibuprofen but is not helping. [id number] [place], [place] [address] said you was going to send her something in but never did, please advise, said she couldn't really wear a brace on her hand at work cause they wouldn't like it to good.

## 2015-11-01 ENCOUNTER — Encounter: Payer: Self-pay | Admitting: Medical

## 2015-11-01 ENCOUNTER — Ambulatory Visit (INDEPENDENT_AMBULATORY_CARE_PROVIDER_SITE_OTHER): Payer: BLUE CROSS/BLUE SHIELD | Admitting: Medical

## 2015-11-01 VITALS — BP 136/90 | HR 90 | Wt 237.0 lb

## 2015-11-01 DIAGNOSIS — Z566 Other physical and mental strain related to work: Secondary | ICD-10-CM

## 2015-11-01 DIAGNOSIS — F40241 Acrophobia: Secondary | ICD-10-CM | POA: Diagnosis not present

## 2015-11-01 NOTE — Progress Notes (Signed)
Subjective: Chief Complaint  Patient presents with  . personal issues.    states she is afraid of heights. but needs doctors note stating she can drive even though she is afraid. has moved far up the ladder, but is scared now that she is being taken off the truck.    Here for driving concerns.  Is a truck driver for [employer], has DOT card.   Drives up to [place], [place], [place] about once monthly.   Has fear of heights, hates driving up in the mountains.  Has fear of coming down the mountain with heavy loads on steep roads.  Asked her boss if she could not drive the mountain route, would rather not drive mountain roads, signed a paper stating such.  That evening she talked to family about this, was worried that she spoke to soon possibly jeopardizing her job.  She went back to her boss again, talked to them about the fear of driving in the mountains, but subsequently her employer's corporate office got involved, advising she couldn't drive if she had restrictions.    Was advised to either get a diagnosis and paperwork that she has an issue that needs to be treated or have letter showing no restrictions.  No other aggravating or relieving factors. No other complaint.      Past Medical History  Diagnosis Date  . Nocturnal enuresis     history of, resolved  . Hip joint pain   . Obesity   . Uterine fibroid     s/p hysterectomy  . Urge incontinence   . Wears glasses   . Influenza vaccine refused 9/14  . Colitis    ROS as in subjective   Objective: BP 136/90 mmHg  Pulse 90  Wt 237 lb (107.502 kg)  Gen: wdwn, nad Psych: pleasant, good eye contact, answers questions appropriately    Assessment: Encounter Diagnoses  Name Primary?  . Work-related stress Yes  . Fear of heights       Plan: Discussed her concerns, ways to move forward including getting extra mountain driving training through work, seeing counseling or psychiatrist if needed, discussed ways to cope,  and being up front with boss about her concerns. .  She notes that she can perform her duties, just doesn't like or feel completely comfortable driving in the mountains.   She has no specific panic attack or other debilitating issue that would keep her from performing her job.   After discussing options, she wishes to remain on her current duties without restriction.  If she continues to have issues or if the issue with driving in the mountains becomes a more substantial issue, then return.  Wrote letter as she requested.

## 2015-11-10 ENCOUNTER — Telehealth: Payer: Self-pay | Admitting: Medical

## 2015-11-10 NOTE — Telephone Encounter (Signed)
Pt called answering service and left message stating that she need [facility]ill [facility] [address]. Pt is on the road driving now and she will need this med for driving and she told Audelia Acton that she had Xanax left but she does not.

## 2015-11-11 NOTE — Telephone Encounter (Signed)
LMTCB

## 2015-11-11 NOTE — Telephone Encounter (Signed)
I know we mentioned possible using medications to control anxiety, but looking back, I can't see where we ever have written Xanax.  Secondly Xanax can be sedating and she certainly doesn't need to be sedated driving.    So first double check either in paper chart or with her about prior Xanax use.

## 2015-11-14 ENCOUNTER — Other Ambulatory Visit: Payer: Self-pay | Admitting: Medical

## 2015-11-14 MED ORDER — ALPRAZOLAM 0.25 MG PO TABS: 0.2500 mg | ORAL_TABLET | Freq: Every evening | ORAL | Status: DC | PRN

## 2015-11-14 NOTE — Telephone Encounter (Signed)
Pt is aware and [facility] [address].

## 2015-11-14 NOTE — Telephone Encounter (Signed)
Call out Xanax, but have her try 1/2 prn, but use caution.  Only use with panicky feeling or bad anxiety, caution due to sedation.

## 2015-11-14 NOTE — Telephone Encounter (Signed)
Pt has taken xanax before. She said that she hasnt been prescribed it by you. She said you told her it was a good idea to take a half of one when she was in for her last visit.

## 2015-11-28 ENCOUNTER — Ambulatory Visit: Payer: BLUE CROSS/BLUE SHIELD | Admitting: Family Medicine

## 2016-06-14 IMAGING — CR DG CHEST 2V
2 series · 2 of 2 positions shown · non-contrast
Comparison: 01/17/2014

CLINICAL DATA: Syncopal episode this morning. Cough and chest
congestion for 1 week.

EXAM:
CHEST  2 VIEW

[w chest pa]
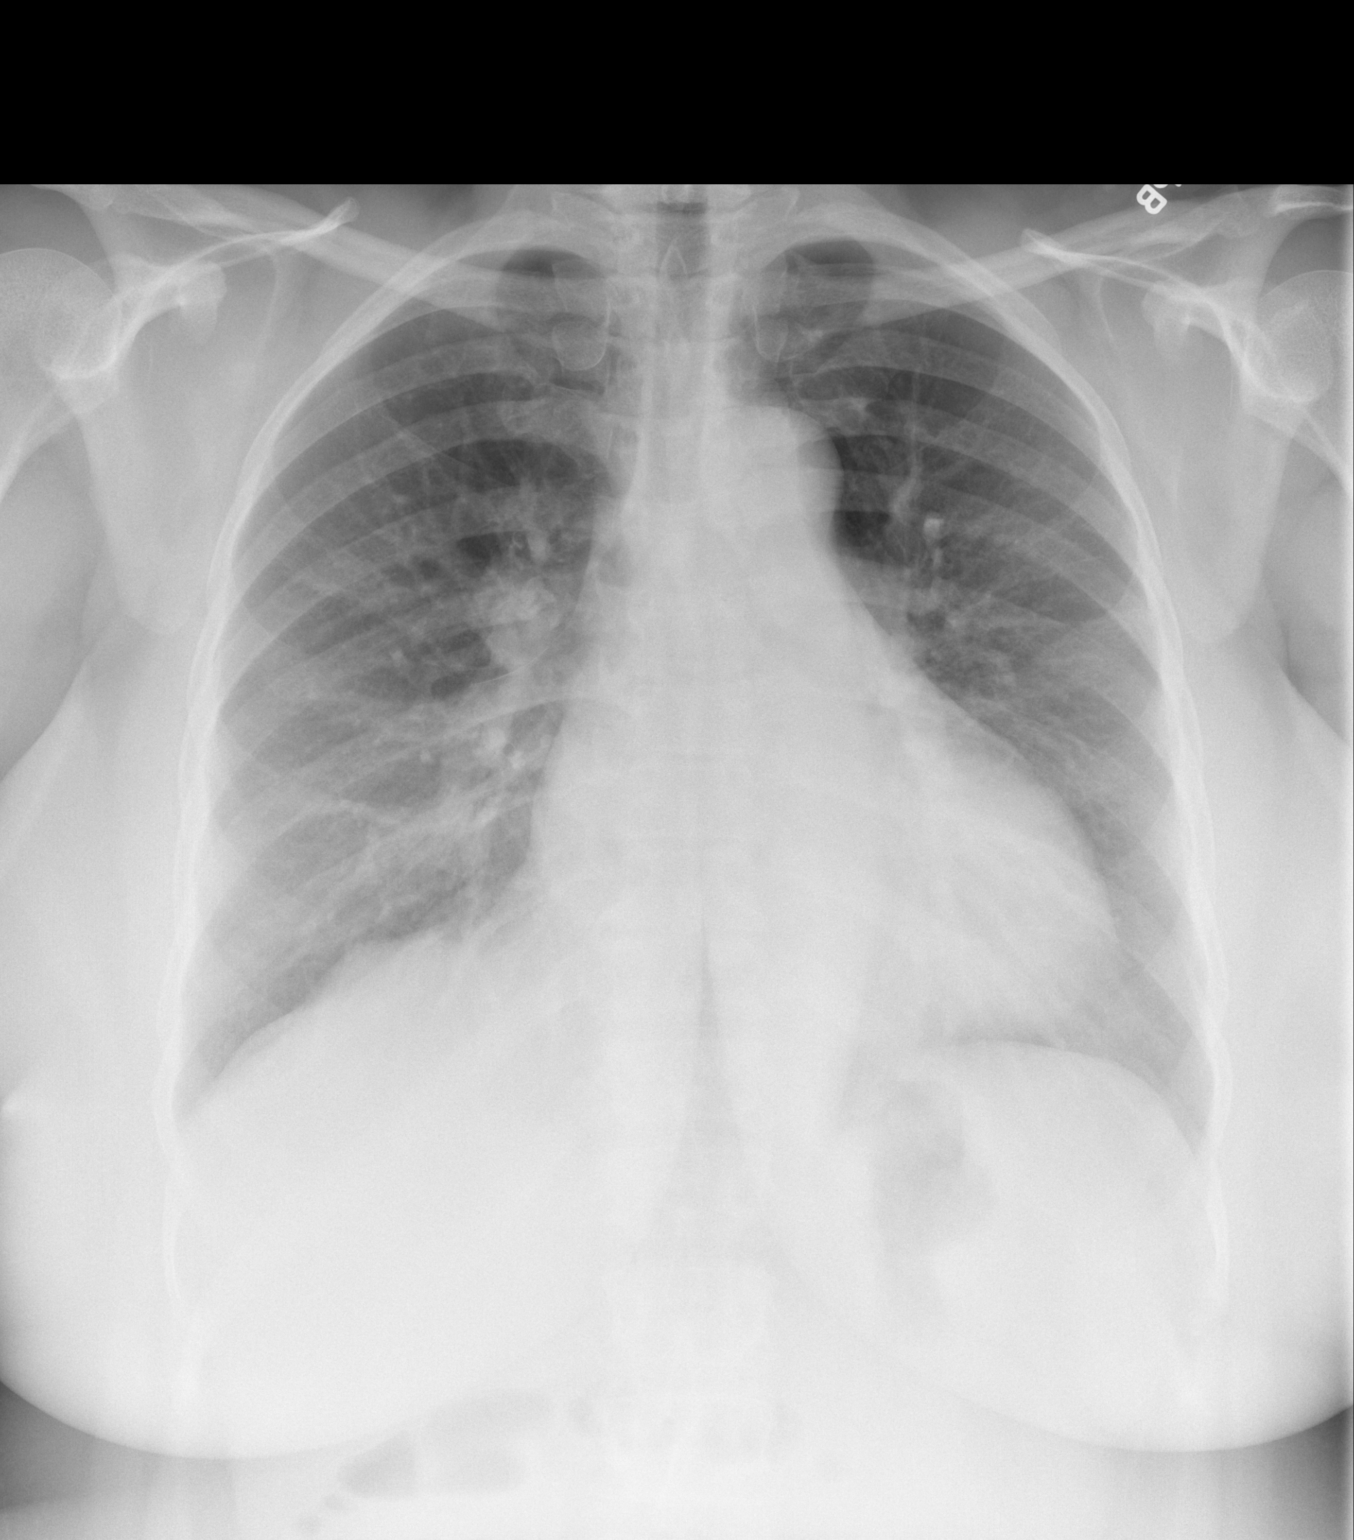

[w chest lat]
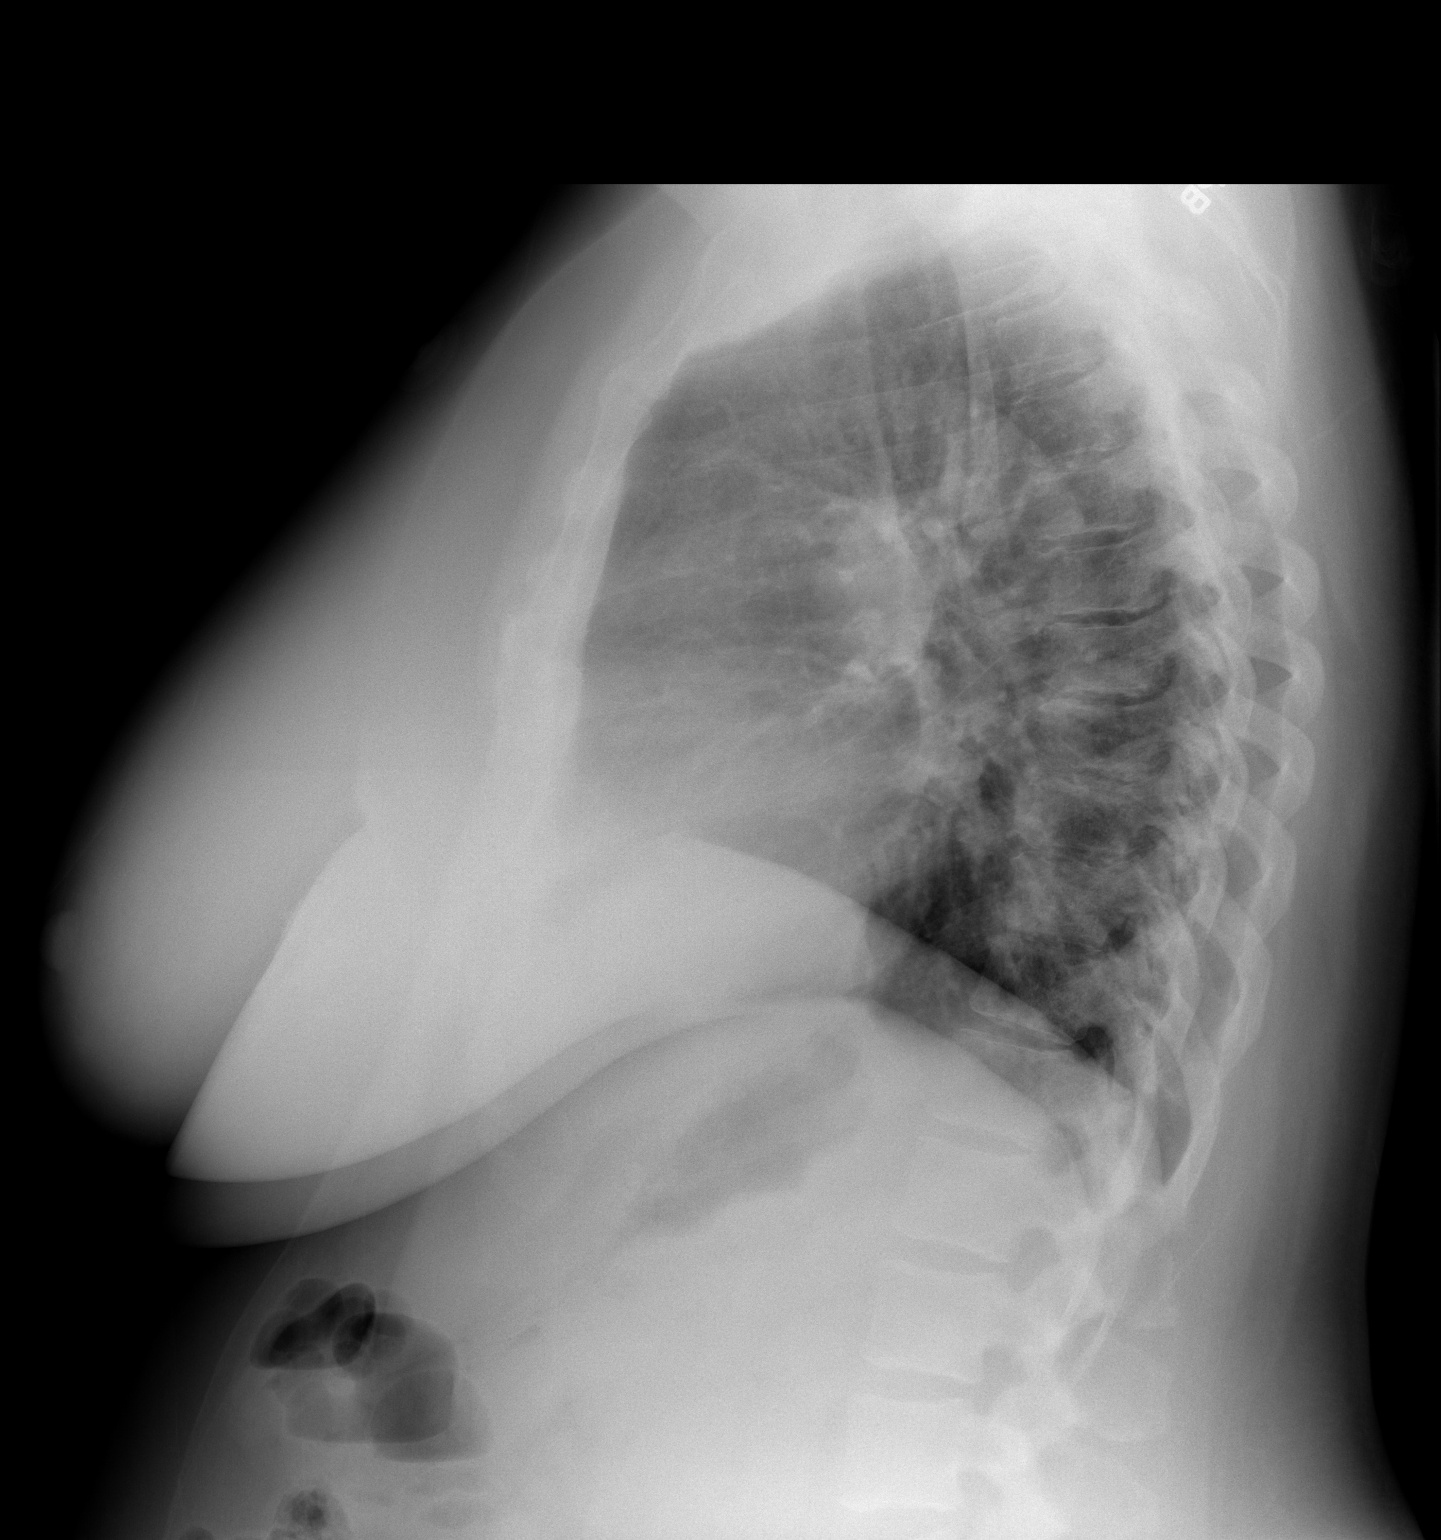

[2 of 2 positions shown; findings below may reference images not displayed]

FINDINGS: The heart size and mediastinal contours are within normal limits.
Both lungs are clear. No evidence of pleural effusion. No mass or
lymphadenopathy identified. The visualized skeletal structures are
unremarkable.
IMPRESSION: No active cardiopulmonary disease.

## 2017-01-30 ENCOUNTER — Telehealth (INDEPENDENT_AMBULATORY_CARE_PROVIDER_SITE_OTHER): Payer: Self-pay | Admitting: *Deleted

## 2017-01-30 NOTE — Telephone Encounter (Signed)
Patient called in this afternoon in regards to her right hip pain. She unfortunately can not come in until April the 30th for an appointment to get a cortisone shot. She would like to know if maybe Dr. Ninfa Linden would prescribe her something for her hip pain. Thank you Her CB # [phone].

## 2017-01-31 ENCOUNTER — Other Ambulatory Visit (INDEPENDENT_AMBULATORY_CARE_PROVIDER_SITE_OTHER): Payer: Self-pay

## 2017-01-31 MED ORDER — TRAMADOL HCL 50 MG PO TABS: ORAL_TABLET | ORAL | 0 refills | Status: DC

## 2017-01-31 NOTE — Telephone Encounter (Signed)
Called into pharmacy, patient aware 

## 2017-01-31 NOTE — Telephone Encounter (Signed)
Can call in tramadol 50mg , 1-2 every 8-12 hours as needed for pain, #60

## 2017-01-31 NOTE — Telephone Encounter (Signed)
Please advise 

## 2017-02-11 ENCOUNTER — Ambulatory Visit (INDEPENDENT_AMBULATORY_CARE_PROVIDER_SITE_OTHER): Payer: BLUE CROSS/BLUE SHIELD | Admitting: Physician Assistant

## 2017-02-11 ENCOUNTER — Encounter (INDEPENDENT_AMBULATORY_CARE_PROVIDER_SITE_OTHER): Payer: Self-pay | Admitting: Physician Assistant

## 2017-02-11 DIAGNOSIS — M7061 Trochanteric bursitis, right hip: Secondary | ICD-10-CM | POA: Diagnosis not present

## 2017-02-11 MED ORDER — DICLOFENAC SODIUM 75 MG PO TBEC: 75.0000 mg | DELAYED_RELEASE_TABLET | Freq: Two times a day (BID) | ORAL | 1 refills | Status: DC

## 2017-02-11 NOTE — Progress Notes (Signed)
Office Visit Note   Patient: Sonya White           Date of Birth: 1961-01-18           MRN: [id number] Visit Date: 02/11/2017              Requested by: Sonya Hurl, PA-C [address] [place],  [address] PCP: Sonya Oxford, PA-C   Assessment & Plan: Visit Diagnoses:  1. Trochanteric bursitis, right hip     Plan: She will perform gastrocsoleus, hamstring, and IT band stretching exercises as discussed with her. Like to see her back 2 weeks to check progress lack of. If she continues to have radicular symptoms down the right leg with obtain AP and lateral views of her lumbar spine. Discussed with her taking no other NSAIDs other than the diclofenac which was prescribed today.  Follow-Up Instructions: Return in about 2 weeks (around 02/25/2017).   Orders:  Orders Placed This Encounter  Procedures  . Large Joint Injection/Arthrocentesis   Meds ordered this encounter  Medications  . diclofenac (VOLTAREN) 75 MG EC tablet    Sig: Take 1 tablet (75 mg total) by mouth 2 (two) times daily.    Dispense:  60 tablet    Refill:  1      Procedures: Large Joint Inj Date/Time: 02/11/2017 3:36 PM Performed by: Sonya White Authorized by: Sonya White   Consent Given by:  Patient Indications:  Pain Location:  Hip Site:  R greater trochanter Needle Size:  25 G Needle Length:  1.5 inches and 3.5 inches Approach:  Lateral Ultrasound Guidance: No   Fluoroscopic Guidance: No   Arthrogram: No   Medications:  40 mg methylPREDNISolone acetate 40 MG/ML; 3 mL lidocaine 1 % Aspiration Attempted: No   Patient tolerance:  Patient tolerated the procedure well with no immediate complications     Clinical Data: No additional findings.   Subjective: Chief Complaint  Patient presents with  . Right Hip - Pain    HPI [name] returns today due to right hip pain lateral aspect . States she does have occasional numbness tingling down the right leg that  the main complaint is lateral right hip pain. She has a history has a history of right hip bursectomy 04/08/2014 this done well. She states pain is very much like she was having prior to surgery was sitting a orders and injection. Review of Systems No chest pain shortness breath fevers chills.  Objective: Vital Signs: There were no vitals taken for this visit.  Physical Exam  Constitutional: She is oriented to person, place, and time. She appears well-developed and well-nourished. No distress.  Neurological: She is alert and oriented to person, place, and time.  Psychiatric: She has a normal mood and affect.    Ortho Exam Right hip excellent range of motion without pain. She has tenderness over the right trochanteric region. She has a well healed scar in this region. There is no abnormal warmth erythema or rashes. Specialty Comments:  No specialty comments available.  Imaging: No results found.   PMFS History: Patient Active Problem List   Diagnosis Date Noted  . Trochanteric bursitis, right hip 02/11/2017  . Obesity 10/31/2011   Past Medical History:  Diagnosis Date  . Colitis   . Hip joint pain   . Influenza vaccine refused 9/14  . Nocturnal enuresis    history of, resolved  . Obesity   . Urge incontinence   . Uterine fibroid  s/p hysterectomy  . Wears glasses     Family History  Problem Relation Age of Onset  . Cancer Mother 67    multiple myeloma 23yo; breast 54yo  . Glaucoma Father   . Glaucoma Sister   . Heart disease Brother     thinks it was related to steroid abuse  . Diabetes Neg Hx   . Stroke Neg Hx   . Hypertension Neg Hx     Past Surgical History:  Procedure Laterality Date  . ABDOMINAL HYSTERECTOMY     due to fibroids; partial; has both ovaries  . CESAREAN SECTION    . COLONOSCOPY     pending 08/10/13, Dr. Collene Mares  . WISDOM TOOTH EXTRACTION     Social History   Occupational History  . Not on file.   Social History Main Topics  . Smoking  status: Never Smoker  . Smokeless tobacco: Never Used  . Alcohol use 12.0 oz/week    20 Shots of liquor per week     Comment: occasional  . Drug use: No  . Sexual activity: Not on file

## 2017-02-12 MED ORDER — METHYLPREDNISOLONE ACETATE 40 MG/ML IJ SUSP
40.0000 mg | INTRAMUSCULAR | Status: AC | PRN
  Administered 2017-02-11: 40 mg via INTRA_ARTICULAR

## 2017-02-12 MED ORDER — LIDOCAINE HCL 1 % IJ SOLN
3.0000 mL | INTRAMUSCULAR | Status: AC | PRN
  Administered 2017-02-11: 3 mL

## 2017-02-25 ENCOUNTER — Ambulatory Visit (INDEPENDENT_AMBULATORY_CARE_PROVIDER_SITE_OTHER): Payer: BLUE CROSS/BLUE SHIELD | Admitting: Orthopaedic Surgery

## 2018-04-28 LAB — HM MAMMOGRAPHY

## 2018-05-01 ENCOUNTER — Encounter: Payer: Self-pay | Admitting: Medical

## 2018-07-14 ENCOUNTER — Emergency Department (HOSPITAL_BASED_OUTPATIENT_CLINIC_OR_DEPARTMENT_OTHER)
Admission: EM | Admit: 2018-07-14 | Discharge: 2018-07-14 | Disposition: A | Discharge status: Home / Self Care | Payer: Managed Care, Other (non HMO) | Attending: Emergency Medicine | Admitting: Emergency Medicine

## 2018-07-14 ENCOUNTER — Other Ambulatory Visit: Payer: Self-pay

## 2018-07-14 ENCOUNTER — Encounter (HOSPITAL_BASED_OUTPATIENT_CLINIC_OR_DEPARTMENT_OTHER): Payer: Self-pay | Admitting: *Deleted

## 2018-07-14 DIAGNOSIS — M542 Cervicalgia: Secondary | ICD-10-CM | POA: Diagnosis present

## 2018-07-14 DIAGNOSIS — M62838 Other muscle spasm: Secondary | ICD-10-CM | POA: Diagnosis not present

## 2018-07-14 DIAGNOSIS — Z79899 Other long term (current) drug therapy: Secondary | ICD-10-CM | POA: Diagnosis not present

## 2018-07-14 MED ORDER — DIAZEPAM 2 MG PO TABS: 2.0000 mg | ORAL_TABLET | Freq: Three times a day (TID) | ORAL | 0 refills | Status: DC | PRN

## 2018-07-14 NOTE — ED Triage Notes (Signed)
Pt c/o neck pain and stiffness x 1 week , denies fever .

## 2018-07-14 NOTE — Discharge Instructions (Addendum)
Take Valium every 8 hours as needed for muscle spasms.  You can continue taking ibuprofen as prescribed over-the-counter, as needed for pain.  Use a heating pad 3-4 times daily alternating 20 minutes on, 20 minutes off.  Attempt gentle stretching of your neck few times daily.  Please follow-up with your doctor in few days if symptoms are not improving as well as for recheck of your blood pressure.  Please return the emergency department immediately if you develop any new or worsening symptoms including fever, severe sore throat, inability to move your neck at all, or any other concerning symptoms.

## 2018-07-15 NOTE — ED Provider Notes (Signed)
[facility] Provider Note   CSN: [id number] Arrival date & time: 07/14/18  1824     History   Chief Complaint Chief Complaint  Patient presents with  . Neck Pain    HPI Sonya White is a 57 y.o. female who presents with a one-week history of neck pain.  She has had associated stiffness.  Her pain is worsened with movement.  She denies any fever, headache, sore throat.  She reports she just woke up with the pain one day.  She has been taking ibuprofen without significant relief.  She also reports a hoarseness for the past couple days, however she has gotten this intermittently for the past year.  She thinks it may be related to allergies.  She denies any injury to her neck.  She drives a tractor trailer for living.  She denies any chest pain, shortness of breath, nausea, vomiting.  HPI  Past Medical History:  Diagnosis Date  . Colitis   . Hip joint pain   . Influenza vaccine refused 9/14  . Nocturnal enuresis    history of, resolved  . Obesity   . Urge incontinence   . Uterine fibroid    s/p hysterectomy  . Wears glasses     Patient Active Problem List   Diagnosis Date Noted  . Trochanteric bursitis, right hip 02/11/2017  . Obesity 10/31/2011    Past Surgical History:  Procedure Laterality Date  . ABDOMINAL HYSTERECTOMY     due to fibroids; partial; has both ovaries  . CESAREAN SECTION    . COLONOSCOPY     pending 08/10/13, Dr. Collene Mares  . WISDOM TOOTH EXTRACTION       OB History   None      Home Medications    Prior to Admission medications   Medication Sig Start Date End Date Taking? Authorizing Provider  albuterol (PROVENTIL HFA;VENTOLIN HFA) 108 (90 BASE) MCG/ACT inhaler Inhale 1-2 puffs into the lungs every 6 (six) hours as needed for wheezing. 01/28/15   Tanna Furry, MD  ALPRAZolam Duanne Moron) 0.25 MG tablet Take 1 tablet (0.25 mg total) by mouth at bedtime as needed for anxiety. 11/14/15   Tysinger, Camelia Eng, PA-C  diazepam  (VALIUM) 2 MG tablet Take 1 tablet (2 mg total) by mouth every 8 (eight) hours as needed for muscle spasms. 07/14/18   Mora Pedraza, Bea Graff, PA-C  diclofenac (VOLTAREN) 75 MG EC tablet Take 1 tablet (75 mg total) by mouth 2 (two) times daily. 02/11/17   Pete Pelt, PA-C  terbinafine (LAMISIL) 250 MG tablet Take 1 tablet (250 mg total) by mouth daily. 09/27/15   Denita Lung, MD  traMADol Veatrice Bourbon) 50 MG tablet 1-2 every 8-12 hrs prn pain 01/31/17   Mcarthur Rossetti, MD    Family History Family History  Problem Relation Age of Onset  . Cancer Mother 31       multiple myeloma 26yo; breast 55yo  . Glaucoma Father   . Glaucoma Sister   . Heart disease Brother        thinks it was related to steroid abuse  . Diabetes Neg Hx   . Stroke Neg Hx   . Hypertension Neg Hx     Social History Social History   Tobacco Use  . Smoking status: Never Smoker  . Smokeless tobacco: Never Used  Substance Use Topics  . Alcohol use: Yes    Alcohol/week: 20.0 standard drinks    Types: 20 Shots of liquor per  week    Comment: occasional  . Drug use: No     Allergies   Patient has no known allergies.   Review of Systems Review of Systems  Constitutional: Negative for fever.  HENT: Positive for voice change. Negative for sore throat.   Respiratory: Negative for shortness of breath.   Cardiovascular: Negative for chest pain.  Gastrointestinal: Negative for nausea and vomiting.  Musculoskeletal: Positive for myalgias, neck pain and neck stiffness.     Physical Exam Updated Vital Signs BP (!) 166/98 (BP Location: Right Arm)   Pulse 72   Temp 99 F (37.2 C) (Oral)   Resp 18   Ht '5\' 3"'  (1.6 m)   Wt 108 kg   SpO2 97%   BMI 42.16 kg/m   Physical Exam  Constitutional: She appears well-developed and well-nourished. No distress.  HENT:  Head: Normocephalic and atraumatic.  Mouth/Throat: Oropharynx is clear and moist. No oropharyngeal exudate.  Eyes: Pupils are equal, round, and  reactive to light. Conjunctivae are normal. Right eye exhibits no discharge. Left eye exhibits no discharge. No scleral icterus.  Neck: Normal range of motion. Neck supple. Muscular tenderness present. No neck rigidity. No thyromegaly present.    Patient with noted spasm on palpation of the cervical muscles, patient with range of motion intact, chin to chest without difficulty  Cardiovascular: Normal rate, regular rhythm, normal heart sounds and intact distal pulses. Exam reveals no gallop and no friction rub.  No murmur heard. Pulmonary/Chest: Effort normal and breath sounds normal. No stridor. No respiratory distress. She has no wheezes. She has no rales.  Abdominal: Soft. Bowel sounds are normal. She exhibits no distension. There is no tenderness. There is no rebound and no guarding.  Musculoskeletal: She exhibits no edema.  Lymphadenopathy:    She has no cervical adenopathy.  Neurological: She is alert. Coordination normal.  Normal sensation throughout, equal bilateral grip strength, 5/5 strength to bilateral upper extremities.  Skin: Skin is warm and dry. No rash noted. She is not diaphoretic. No pallor.  Psychiatric: She has a normal mood and affect.  Nursing note and vitals reviewed.    ED Treatments / Results  Labs (all labs ordered are listed, but only abnormal results are displayed) Labs Reviewed - No data to display  EKG None  Radiology No results found.  Procedures Procedures (including critical care time)  Medications Ordered in ED Medications - No data to display   Initial Impression / Assessment and Plan / ED Course  I have reviewed the triage vital signs and the nursing notes.  Pertinent labs & imaging results that were available during my care of the patient were reviewed by me and considered in my medical decision making (see chart for details).     Patient with 1 week history of neck pain.  She is afebrile and denies any other symptoms.  She denies any  numbness or tingling.  Suspect muscle spasm of the neck.  No signs of infection or meningitis.  I do not feel patient's current hoarseness is related to her current neck pain, she has been getting intermittently for a year.  Most likely allergy related, as she suspects.  Patient is afebrile.  No indication for imaging at this time.  Will treat with heat and Valium as a muscle relaxer.  Patient advised not to drive with this medication.  Patient to follow-up with her PCP in 2 to 3 days for recheck.  Strict return precautions given.  She understands and agrees  with plan.  Patient's blood pressure noted to be elevated and advised to have recheck to PCP.  Patient vitals stable and discharged in satisfactory condition. I discussed patient case with Dr. Wilson Singer who guided the patient's management and agrees with plan.   Final Clinical Impressions(s) / ED Diagnoses   Final diagnoses:  Muscle spasms of neck    ED Discharge Orders         Ordered    diazepam (VALIUM) 2 MG tablet  Every 8 hours PRN     07/14/18 2037           Frederica Kuster, PA-C 07/15/18 0004    Virgel Manifold, MD 07/23/18 1306

## 2018-07-18 ENCOUNTER — Ambulatory Visit (INDEPENDENT_AMBULATORY_CARE_PROVIDER_SITE_OTHER): Payer: Managed Care, Other (non HMO) | Admitting: Medical

## 2018-07-18 ENCOUNTER — Encounter: Payer: Self-pay | Admitting: Medical

## 2018-07-18 VITALS — BP 120/76 | HR 90 | Temp 97.9°F | Resp 16 | Ht 63.0 in | Wt 239.2 lb

## 2018-07-18 DIAGNOSIS — M436 Torticollis: Secondary | ICD-10-CM | POA: Diagnosis not present

## 2018-07-18 DIAGNOSIS — M542 Cervicalgia: Secondary | ICD-10-CM

## 2018-07-18 DIAGNOSIS — M62838 Other muscle spasm: Secondary | ICD-10-CM | POA: Diagnosis not present

## 2018-07-18 MED ORDER — MELOXICAM 15 MG PO TABS: 15.0000 mg | ORAL_TABLET | Freq: Every day | ORAL | 0 refills | Status: DC

## 2018-07-18 MED ORDER — HYDROCODONE-ACETAMINOPHEN 5-325 MG PO TABS: 1.0000 | ORAL_TABLET | Freq: Four times a day (QID) | ORAL | 0 refills | Status: DC | PRN

## 2018-07-18 MED ORDER — TIZANIDINE HCL 4 MG PO TABS: 4.0000 mg | ORAL_TABLET | Freq: Four times a day (QID) | ORAL | 0 refills | Status: DC | PRN

## 2018-07-18 MED ORDER — TRAMADOL HCL 50 MG PO TABS: ORAL_TABLET | ORAL | 0 refills | Status: DC

## 2018-07-18 NOTE — Patient Instructions (Signed)
Recommendations:  Over the next few days use some gentle stretching of your neck and upper back  You can use heat pad or hot shower or hot tub to help with muscle tension  Since you have meloxicam anti-inflammatory at home use this daily for the next 4 to 5 days  Stop the Valium  Instead begin tizanidine muscle relaxer either 1 or 2 times a day for the next few days for muscle tension and spasm  For worse or breakthrough pain you can use short-term tramadol 1 tablet every 8-12 hours as needed  We will refer you to physical therapy  I recommend he return soon for physical as I have not seen you for routine preventative care in over a year

## 2018-07-18 NOTE — Progress Notes (Signed)
Subjective: Chief Complaint  Patient presents with  . neck pain    stiff neck pain X 1week   Here for stiff neck.   Denies injury, trauma, fall.  Has stiff neck, pain, went to [facility] on [address], 4 nights ago.   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  Diagnosed with pulled muscle, was given valium.  Has used valium twice.   Didn't help other than make her feel sleepy.   Using ibuprofen 4 OTC pills twice daily No arm pain, no paresthesias of arms.  No fever, no recent strenuous activity.  No other aggravating or relieving factors. No other complaint.   Past Medical History:  Diagnosis Date  . Colitis   . Hip joint pain   . Influenza vaccine refused 9/14  . Nocturnal enuresis    history of, resolved  . Obesity   . Urge incontinence   . Uterine fibroid    s/p hysterectomy  . Wears glasses    Current Outpatient Medications on File Prior to Visit  Medication Sig Dispense Refill  . albuterol (PROVENTIL HFA;VENTOLIN HFA) 108 (90 BASE) MCG/ACT inhaler Inhale 1-2 puffs into the lungs every 6 (six) hours as needed for wheezing. 1 Inhaler 0  . ALPRAZolam (XANAX) 0.25 MG tablet Take 1 tablet (0.25 mg total) by mouth at bedtime as needed for anxiety. 15 tablet 0  . terbinafine (LAMISIL) 250 MG tablet Take 1 tablet (250 mg total) by mouth daily. (Patient not taking: Reported on 07/18/2018) 90 tablet 1   No current facility-administered medications on file prior to visit.    ROS as in subjective    Objective: BP 120/76   Pulse 90   Temp 97.9 F (36.6 C) (Oral)   Resp 16   Ht 5\' 3"  (1.6 m)   Wt 239 lb 3.2 oz (108.5 kg)   SpO2 98%   BMI 42.37 kg/m   General: Well-developed well-nourished no acute distress Neck, mild tenderness bilateral and posterior neck muscles, decreased range of motion in all directions, no lymphadenopathy no mass no thyromegaly Chest and upper back nontender, arms nontender, normal range of motion of arms, arms  neurovascularly intact Skin no rash no erythema no bruising Head nontender normocephalic HEENT unremarkable      Assessment: Encounter Diagnoses  Name Primary?  . Torticollis Yes  . Neck pain   . Neck muscle spasm     Plan: Discussed her symptoms and exam findings.    I reviewed the recent emergency department visit for the same issues she came in for today  Reviewed the recommendations below.  Patient Instructions  Recommendations:  Over the next few days use some gentle stretching of your neck and upper back  You can use heat pad or hot shower or hot tub to help with muscle tension  Since you have meloxicam anti-inflammatory at home use this daily for the next 4 to 5 days  Stop the Valium  Instead begin tizanidine muscle relaxer either 1 or 2 times a day for the next few days for muscle tension and spasm  For worse or breakthrough pain you can use short-term tramadol 1 tablet every 8-12 hours as needed  We will refer you to physical therapy  I recommend he return soon for physical as I have not seen you for routine preventative care in over a year    Metzli was seen today for neck pain.  Diagnoses and all orders for this visit:  Torticollis -     Ambulatory referral to Physical Therapy  Neck pain -     Ambulatory referral to Physical Therapy  Neck muscle spasm -     Ambulatory referral to Physical Therapy  Other orders -     Discontinue: traMADol (ULTRAM) 50 MG tablet; 1-2 every 8-12 hrs prn pain -     tiZANidine (ZANAFLEX) 4 MG tablet; Take 1 tablet (4 mg total) by mouth every 6 (six) hours as needed for muscle spasms. -     meloxicam (MOBIC) 15 MG tablet; Take 1 tablet (15 mg total) by mouth daily. -     HYDROcodone-acetaminophen (NORCO) 5-325 MG tablet; Take 1 tablet by mouth every 6 (six) hours as needed for moderate pain.

## 2018-08-20 ENCOUNTER — Other Ambulatory Visit: Payer: Self-pay | Admitting: Medical

## 2018-08-20 NOTE — Telephone Encounter (Signed)
Is this ok to refill?  

## 2018-08-26 ENCOUNTER — Telehealth: Payer: Self-pay | Admitting: Medical

## 2018-08-26 NOTE — Telephone Encounter (Signed)
Please call   You set her up for PT for her neck. She cancelled PT because she thought neck was getting better. She is having trouble with h er neck again and would like to be set up for PT again

## 2018-08-27 ENCOUNTER — Other Ambulatory Visit: Payer: Self-pay

## 2018-08-27 DIAGNOSIS — M62838 Other muscle spasm: Secondary | ICD-10-CM

## 2018-08-27 DIAGNOSIS — M542 Cervicalgia: Secondary | ICD-10-CM

## 2018-08-27 NOTE — Telephone Encounter (Signed)
Ok, refer to PT

## 2018-08-27 NOTE — Telephone Encounter (Signed)
Per Audelia Acton schedule patient with Ortho since she didn't go to PT.   Referral put in epic.

## 2018-09-02 ENCOUNTER — Other Ambulatory Visit (INDEPENDENT_AMBULATORY_CARE_PROVIDER_SITE_OTHER): Payer: Self-pay | Admitting: Radiology

## 2018-09-02 ENCOUNTER — Ambulatory Visit (INDEPENDENT_AMBULATORY_CARE_PROVIDER_SITE_OTHER): Payer: Managed Care, Other (non HMO)

## 2018-09-02 ENCOUNTER — Ambulatory Visit (INDEPENDENT_AMBULATORY_CARE_PROVIDER_SITE_OTHER): Payer: Managed Care, Other (non HMO) | Admitting: Orthopaedic Surgery

## 2018-09-02 ENCOUNTER — Encounter (INDEPENDENT_AMBULATORY_CARE_PROVIDER_SITE_OTHER): Payer: Self-pay | Admitting: Orthopaedic Surgery

## 2018-09-02 VITALS — BP 170/94 | HR 86 | Resp 16 | Ht 63.0 in | Wt 238.0 lb

## 2018-09-02 DIAGNOSIS — M542 Cervicalgia: Secondary | ICD-10-CM

## 2018-09-02 MED ORDER — CYCLOBENZAPRINE HCL 5 MG PO TABS: ORAL_TABLET | ORAL | 0 refills | Status: DC

## 2018-09-02 NOTE — Progress Notes (Signed)
Office Visit Note   Patient: Sonya White           Date of Birth: [birth year]           MRN: [id number] Visit Date: 09/02/2018              Requested by: Carlena Hurl, PA-C [address] [place], [place] [address] PCP: Carlena Hurl, PA-C   Assessment & Plan: Visit Diagnoses:  1. Neck pain     Plan: Cervical spine strain associated with osteoarthritis.  We will try Flexeril and a course of physical therapy.  Return in 3 to 4 weeks  Follow-Up Instructions: Return in about 1 month (around 10/02/2018).   Orders:  Orders Placed This Encounter  Procedures  . XR Cervical Spine 2 or 3 views   No orders of the defined types were placed in this encounter.     Procedures: No procedures performed   Clinical Data: No additional findings.   Subjective: Chief Complaint  Patient presents with  . Neck - Pain  . Neck Pain    Neck pain x 2 1/2 months, no injury, limited range of motion, IBU doesn't help much, no surgery to neck, not diabetic  Sonya White first noted onset of neck pain the day after painting a room in her house.  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  Her neck is stiff and sore and achy.  She has not had any pain referred to either upper extremity or shoulder.  This or tingling.  She is tried over-the-counter NSAIDs and Tylenol with little relief no prior injury or surgery  HPI  Review of Systems  Constitutional: Negative for fatigue.  HENT: Negative for trouble swallowing.   Eyes: Negative for pain.  Respiratory: Negative for shortness of breath.   Cardiovascular: Negative for leg swelling.  Gastrointestinal: Negative for constipation.  Endocrine: Positive for heat intolerance.  Genitourinary: Negative for difficulty urinating.  Musculoskeletal: Positive for neck pain and neck stiffness.  Skin: Negative for rash.  Allergic/Immunologic: Negative for food  allergies.  Neurological: Negative for numbness.  Hematological: Does not bruise/bleed easily.  Psychiatric/Behavioral: Positive for sleep disturbance.     Objective: Vital Signs: BP (!) 170/94 (BP Location: Left Arm, Patient Position: Sitting, Cuff Size: Normal)   Pulse 86   Resp 16   Ht '5\' 3"'  (1.6 m)   Wt 238 lb (108 kg)   BMI 42.16 kg/m   Physical Exam  Constitutional: She is oriented to person, place, and time. She appears well-developed and well-nourished.  HENT:  Mouth/Throat: Oropharynx is clear and moist.  Eyes: Pupils are equal, round, and reactive to light. EOM are normal.  Pulmonary/Chest: Effort normal.  Neurological: She is alert and oriented to person, place, and time.  Skin: Skin is warm and dry.  Psychiatric: She has a normal mood and affect. Her behavior is normal.    Ortho Exam awake alert and oriented x3.  Comfortable sitting.  Considerable stiffness cervical spine with motion.  Lacked about a fingerbreadth and a half from touching her chin to her chest.  Only about 50% of normal neck extension and rotation of the right to the left.  Spasm of posterior cervical musculature.  No pain with range of motion of either upper extremity cure.  Good grip and good release.  Neurologically intact Specialty Comments:  No specialty comments available.  Imaging: Xr  Cervical Spine 2 Or 3 Views  Result Date: 09/02/2018 Films of the cervical spine obtained in 2 projections.  There is straightening of the normal cervical lordosis.  Anterior osteophytes are identified at C4-5, C5-6 and C6-7 along the anterior longitudinal ligament.  Slight listhesis C7 on T1.  Disc space well-maintained.    PMFS History: Patient Active Problem List   Diagnosis Date Noted  . Trochanteric bursitis, right hip 02/11/2017  . Obesity 10/31/2011   Past Medical History:  Diagnosis Date  . Colitis   . Hip joint pain   . Influenza vaccine refused 9/14  . Nocturnal enuresis    history of,  resolved  . Obesity   . Urge incontinence   . Uterine fibroid    s/p hysterectomy  . Wears glasses     Family History  Problem Relation Age of Onset  . Cancer Mother 48       multiple myeloma 18yo; breast 55yo  . Glaucoma Father   . Glaucoma Sister   . Heart disease Brother        thinks it was related to steroid abuse  . Diabetes Neg Hx   . Stroke Neg Hx   . Hypertension Neg Hx     Past Surgical History:  Procedure Laterality Date  . ABDOMINAL HYSTERECTOMY     due to fibroids; partial; has both ovaries  . CESAREAN SECTION    . COLONOSCOPY     pending 08/10/13, Dr. Collene Mares  . HIP SURGERY    . KNEE ARTHROSCOPY    . WISDOM TOOTH EXTRACTION     Social History   Occupational History  . Not on file  Tobacco Use  . Smoking status: Never Smoker  . Smokeless tobacco: Never Used  Substance and Sexual Activity  . Alcohol use: Yes    Alcohol/week: 20.0 standard drinks    Types: 20 Shots of liquor per week    Comment: occasional  . Drug use: No  . Sexual activity: Not on file

## 2018-09-29 ENCOUNTER — Ambulatory Visit (INDEPENDENT_AMBULATORY_CARE_PROVIDER_SITE_OTHER): Payer: Managed Care, Other (non HMO) | Admitting: Orthopaedic Surgery

## 2019-01-14 ENCOUNTER — Ambulatory Visit (INDEPENDENT_AMBULATORY_CARE_PROVIDER_SITE_OTHER): Payer: Managed Care, Other (non HMO) | Admitting: Medical

## 2019-01-14 ENCOUNTER — Other Ambulatory Visit: Payer: Self-pay

## 2019-01-14 VITALS — Temp 98.6°F | Resp 16 | Ht 63.0 in | Wt 240.0 lb

## 2019-01-14 DIAGNOSIS — R933 Abnormal findings on diagnostic imaging of other parts of digestive tract: Secondary | ICD-10-CM

## 2019-01-14 DIAGNOSIS — Z20818 Contact with and (suspected) exposure to other bacterial communicable diseases: Secondary | ICD-10-CM

## 2019-01-14 DIAGNOSIS — Z209 Contact with and (suspected) exposure to unspecified communicable disease: Secondary | ICD-10-CM

## 2019-01-14 NOTE — Progress Notes (Signed)
Subjective:     Patient ID: Sonya White, female   DOB: 1960-12-03, 58 y.o.   MRN: [id number]  Documentation for virtual audio and video telecommunications through [place] encounter:  The patient was located at home. The provider was located in the office. The patient did consent to this visit and is aware of possible charges through their insurance for this visit.  The other persons participating in this telemedicine service were none. Time spent on call was 8 minutes and in review of previous records >8 minutes total.  This virtual service is not related to other E/M service within previous 7 days.   HPI Chief Complaint  Patient presents with  . needs test    her job put her into    She had called him to see if it is possible to be tested for COVID-19.  She has absolutely no symptoms, feels fine.  Her son works for the [employer], takes care of buses including cleaning them.  There were apparently 3 positive sick patients with COVID-19 running the bases recently, however he did not touch or clean any of those buses.  He lives with Ms. Warrell.  Ms. Roehr employer was aware that he worked for [employer] and due to the potential for exposure advised Ms. Boza to stay home and quarantine for the next 14 days in the off chance she may be exposed unless she can prove with a negative cover test that she can come back to work  She has no other concern  Past Medical History:  Diagnosis Date  . Colitis   . Hip joint pain   . Influenza vaccine refused 9/14  . Nocturnal enuresis    history of, resolved  . Obesity   . Urge incontinence   . Uterine fibroid    s/p hysterectomy  . Wears glasses    No current outpatient medications on file prior to visit.   No current facility-administered medications on file prior to visit.     Review of Systems     Objective:   Physical Exam Temp 98.6 F (37 C) (Oral)   Resp 16   Ht 5\' 3"   (1.6 m)   Wt 240 lb (108.9 kg)   BMI 42.51 kg/m   Gen: wd,wn, nad Answers questions appropriately      Assessment:     Encounter Diagnoses  Name Primary?  . Abnormal colonoscopy Yes  . Exposure to communicable disease        Plan:      Currently she has no symptoms, we discussed that there is a small chance he may have been exposed although based on what she is saying it does not seem that son or her has symptoms or is likely all that higher risk of exposure  Either way she has to comply with her employer.  I advised that we cannot test at this time which could change next week but currently we are not testing unless we send someone to the emergency department that is quite ill.  She is asymptomatic at this time.  She can check back next week if there are new changes in the news about availability of testing here locally  Coronavirus guidance: I know that they like many people are concerned about personal safety and transmission of this Coronavirus.  In general I recommend they avoid contact with people as much as possible for the foreseeable next several weeks, likely through April.   Obviously if they  have to go to the grocery store or fuel station, then limit contact with people, and try to keep at least a 6 foot distance away from folks.  If still working, try and stay well away from people while at work to limit exposure to moisture droplets such as from sneezing, coughing, and general close conversation.   Coronavirus is spread through these droplets.  Likewise, limit touching surfaces others touch such as door knobs, buggy handles, gas pumps.  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  Avoid touching face, eyes, noes.   Wash hands frequently with soap and water for at least 20 minutes  I reviewed her chart briefly and she is up-to-date on mammogram, status post hysterectomy, but is due for repeat  colonoscopy.  Advised to check back with Dr. Collene Mares in the next few months about repeating colonoscopy this year

## 2019-01-28 ENCOUNTER — Ambulatory Visit (INDEPENDENT_AMBULATORY_CARE_PROVIDER_SITE_OTHER): Payer: Managed Care, Other (non HMO) | Admitting: Medical

## 2019-01-28 ENCOUNTER — Encounter: Payer: Self-pay | Admitting: Medical

## 2019-01-28 ENCOUNTER — Other Ambulatory Visit: Payer: Self-pay

## 2019-01-28 VITALS — Ht 63.0 in | Wt 240.0 lb

## 2019-01-28 DIAGNOSIS — R05 Cough: Secondary | ICD-10-CM

## 2019-01-28 DIAGNOSIS — R195 Other fecal abnormalities: Secondary | ICD-10-CM

## 2019-01-28 DIAGNOSIS — K921 Melena: Secondary | ICD-10-CM | POA: Diagnosis not present

## 2019-01-28 DIAGNOSIS — R197 Diarrhea, unspecified: Secondary | ICD-10-CM | POA: Diagnosis not present

## 2019-01-28 DIAGNOSIS — R059 Cough, unspecified: Secondary | ICD-10-CM

## 2019-01-28 NOTE — Progress Notes (Signed)
done

## 2019-01-28 NOTE — Progress Notes (Addendum)
Subjective:     Patient ID: Sonya White, female   DOB: 07-03-1961, 58 y.o.   MRN: [id number]  Documentation for virtual audio and video telecommunications through [place] encounter:  The patient was located at home. The provider was located in the office. The patient did consent to this visit and is aware of possible charges through their insurance for this visit.  The other persons participating in this telemedicine service were none. Time spent on call was 10 minutes and in review of previous records >12 minutes total.  This virtual service is not related to other E/M service within previous 7 days.   HPI Chief Complaint  Patient presents with  . cough    cough, sneezing, diarrhea, abdominal pain, chills runny nose X 3 days   Virtual visit today to discuss symptoms.  I did a visit with her about 2 weeks ago when she was sent home from work due to the small potential of her son being exposed to coronavirus.  She has been fine the last 2 weeks at home.  However 2 days ago she started having some symptoms including loose stools about 2-3 times a day, sneezing, cough, runny nose, but those symptoms have been scattered intermittent throughout the day not significant.  She does not think this is related to allergies as she never gets allergy problems.  She has had some chills, upset stomach, fatigue.  No fever, no body aches, no headache, no sore throat, no shortness of breath, no headache.  She had been quarantine at home away from work for 14 days except she went to a funeral recently where there was about 56 people.  This was outside.  She did wear a mask.  She is also been to the grocery store, but again she was using a mask.  She also notes that her son has been out and about not staying at home as recommended.  So there is the potential for other exposure.  She has a history of ulcers in the colon, colonoscopy back in 2014.  She has had intermittent blood in her stool ever since her  teenage years.  She has had some blood in the stool recently.  Mild.   Past Medical History:  Diagnosis Date  . Colitis   . Hip joint pain   . Influenza vaccine refused 9/14  . Nocturnal enuresis    history of, resolved  . Obesity   . Urge incontinence   . Uterine fibroid    s/p hysterectomy  . Wears glasses    Current Outpatient Medications on File Prior to Visit  Medication Sig Dispense Refill  . meloxicam (MOBIC) 7.5 MG tablet Take 7.5 mg by mouth daily.     No current facility-administered medications on file prior to visit.     Review of Systems     Objective:   Physical Exam Ht 5\' 3"  (1.6 m)   Wt 240 lb (108.9 kg)   BMI 42.51 kg/m   Gen: wd,wn, nad Answers questions appropriately      Assessment:     Encounter Diagnoses  Name Primary?  . Cough Yes  . Loose stools   . Blood in the stool        Plan:      We discussed the limitations of virtual encounter.  Her symptoms currently sound mild in regards to the respiratory symptoms.  She denies history of allergies although I cannot rule out that this is just an allergy issue.  She may  be starting to get early symptoms of a cold.  Unfortunately we do not have readily available COVID rapid testing so I cannot differentiate between a cold and early COVID and we discussed this.  I will give her a work note out the next 3 days but advise she call back in 2 days to give an update on symptoms.  Overall she does not sound significantly ill today but in light of the coronavirus pandemic, stay at home measures, and her current active cough, her employer advise she stay at home  I reiterated the need for stay at home measures to be followed by her and her son to try to minimize exposures  In the meantime use Robitussin-DM or Benadryl, rest, hydrate well, and use Pepto-Bismol if needed.  If worse abdominal pain, or for symptoms in the meantime call back.  Otherwise touch base with me in 2 days  advised she go ahead  and follow up with Sonya White in the next month regarding ongoing chronic blood in stool as she is likely due for repeat colonoscopy

## 2019-08-26 ENCOUNTER — Other Ambulatory Visit (HOSPITAL_COMMUNITY): Payer: Self-pay | Admitting: Orthopedic Surgery

## 2019-08-26 DIAGNOSIS — M79604 Pain in right leg: Secondary | ICD-10-CM

## 2019-08-27 ENCOUNTER — Other Ambulatory Visit: Payer: Self-pay

## 2019-08-27 ENCOUNTER — Ambulatory Visit (HOSPITAL_COMMUNITY)
Admission: RE | Admit: 2019-08-27 | Discharge: 2019-08-27 | Disposition: A | Discharge status: Home / Self Care | Payer: Managed Care, Other (non HMO) | Source: Ambulatory Visit | Attending: Medical | Admitting: Medical

## 2019-08-27 DIAGNOSIS — M7989 Other specified soft tissue disorders: Secondary | ICD-10-CM | POA: Diagnosis present

## 2019-08-27 DIAGNOSIS — M79604 Pain in right leg: Secondary | ICD-10-CM

## 2019-08-27 NOTE — Progress Notes (Signed)
VASCULAR LAB PRELIMINARY  PRELIMINARY  PRELIMINARY  PRELIMINARY  Right lower extremity venous duplex completed.    Preliminary report:  See CV proc for preliminary results.  Called report to Zettie Cooley, [facility], RVT 08/27/2019, 9:20 AM

## 2019-10-06 ENCOUNTER — Encounter: Payer: Self-pay | Admitting: Medical

## 2019-10-06 ENCOUNTER — Ambulatory Visit (INDEPENDENT_AMBULATORY_CARE_PROVIDER_SITE_OTHER): Payer: Managed Care, Other (non HMO) | Admitting: Medical

## 2019-10-06 ENCOUNTER — Other Ambulatory Visit: Payer: Self-pay

## 2019-10-06 VITALS — BP 140/80 | HR 84 | Ht 63.0 in | Wt 242.0 lb

## 2019-10-06 DIAGNOSIS — M5441 Lumbago with sciatica, right side: Secondary | ICD-10-CM | POA: Diagnosis not present

## 2019-10-06 DIAGNOSIS — R202 Paresthesia of skin: Secondary | ICD-10-CM | POA: Diagnosis not present

## 2019-10-06 DIAGNOSIS — G2581 Restless legs syndrome: Secondary | ICD-10-CM | POA: Diagnosis not present

## 2019-10-06 DIAGNOSIS — M79604 Pain in right leg: Secondary | ICD-10-CM

## 2019-10-06 DIAGNOSIS — R030 Elevated blood-pressure reading, without diagnosis of hypertension: Secondary | ICD-10-CM

## 2019-10-06 DIAGNOSIS — G8929 Other chronic pain: Secondary | ICD-10-CM

## 2019-10-06 NOTE — Progress Notes (Signed)
Subjective:  Sonya White is a 58 y.o. female who presents for Chief Complaint  Patient presents with  . Leg Problem    legs jump right side      Here today for possible RLS.  She notes for months when she lies in bed and is still her legs get restless.   Mostly right leg, but occasionally left feels this way.  She notes moving legs often lying in bed.  Doesn't have this sitting or other times.   Similarly, arms feel pressured at time, shakes them to loosen up.   She can get occasion weak feeling in arms.  Sometimes sciatica flares up in low back and right buttock.   No specific numbness in arms or legs.  She did have knee injury at work, but no other injury she attributes to these symptoms. She denies leg swelling.  No saddle anesthesia, no generalized weakness, no slurred speech, no headaches.  otherwise in normal state of health.   No chest pain, no SOB, no dyspnea on exertion.  No other aggravating or relieving factors.    No other c/o.  The following portions of the patient's history were reviewed and updated as appropriate: allergies, current medications, past family history, past medical history, past social history, past surgical history and problem list.  ROS Otherwise as in subjective above  Objective: BP 140/80   Pulse 84   Ht 5\' 3"  (1.6 m)   Wt 242 lb (109.8 kg)   SpO2 98%   BMI 42.87 kg/m   Wt Readings from Last 3 Encounters:  10/06/19 242 lb (109.8 kg)  01/28/19 240 lb (108.9 kg)  01/14/19 240 lb (108.9 kg)   BP Readings from Last 3 Encounters:  10/06/19 140/80  09/02/18 (!) 170/94  07/18/18 120/76    General appearance: alert, no distress, well developed, well nourished, obese African-American female Neck: supple, no lymphadenopathy, no thyromegaly, no masses, no bruits Heart: RRR, normal S1, S2, no murmurs Lungs: CTA bilaterally, no wheezes, rhonchi, or rales Pulses: 2+ radial pulses, 2+ pedal pulses, normal cap refill Ext: no edema Neuro: CN II through XII  intact, DTRs normal, sensation normal, strength normal, no focal deficit MSK: Arms unremarkable, normal exam, normal range of motion.  She is guarded on the right leg and walking with a limp due to recent surgery on the right leg, otherwise legs nontender, no obvious deformity or swelling otherwise,    Assessment: Encounter Diagnoses  Name Primary?  . Restless leg Yes  . Pain in inferior right lower extremity   . Chronic right-sided low back pain with right-sided sciatica   . Paresthesia of arm   . Leg paresthesia   . Elevated blood-pressure reading without diagnosis of hypertension      Plan: We discussed symptoms, differential.  Labs today.  She could possibly have RLS, but I think she could also have some lumbar radicular issues going on as well in the right. She recently had leg surgery from a employment injury under [employer] so this complicates the picture a bit.  She notes that she may still have to have another procedure.  She is doing physical therapy regularly.   She has good pulses in the legs, no signs of neuropathy in the arms or legs.  If labs are normal consider trial of Requip  Elevated blood pressure today.  She has a cuff at home and she will start monitoring this  Work on lifestyle changes, weight loss  Iara was seen today  for leg problem.  Diagnoses and all orders for this visit:  Restless leg -     Comprehensive metabolic panel -     CBC with Differential -     TSH regular -     B12 regular  Pain in inferior right lower extremity -     Comprehensive metabolic panel -     CBC with Differential -     TSH regular -     B12 regular  Chronic right-sided low back pain with right-sided sciatica -     Comprehensive metabolic panel -     CBC with Differential -     TSH regular -     B12 regular  Paresthesia of arm -     Comprehensive metabolic panel -     CBC with Differential -     TSH regular -     B12 regular  Leg paresthesia -      Comprehensive metabolic panel -     CBC with Differential -     TSH regular -     B12 regular  Elevated blood-pressure reading without diagnosis of hypertension    Follow up: pending labs

## 2019-10-07 ENCOUNTER — Other Ambulatory Visit: Payer: Self-pay | Admitting: Medical

## 2019-10-07 LAB — COMPREHENSIVE METABOLIC PANEL
ALT: 8 IU/L (ref 0–32)
AST: 14 IU/L (ref 0–40)
Albumin/Globulin Ratio: 1.3 (ref 1.2–2.2)
Albumin: 4 g/dL (ref 3.8–4.9)
Alkaline Phosphatase: 62 IU/L (ref 39–117)
BUN/Creatinine Ratio: 10 (ref 9–23)
BUN: 7 mg/dL (ref 6–24)
Bilirubin Total: 0.4 mg/dL (ref 0.0–1.2)
CO2: 26 mmol/L (ref 20–29)
Calcium: 9.4 mg/dL (ref 8.7–10.2)
Chloride: 101 mmol/L (ref 96–106)
Creatinine, Ser: 0.69 mg/dL (ref 0.57–1.00)
GFR calc Af Amer: 113 mL/min/{1.73_m2} (ref >59)
GFR calc non Af Amer: 98 mL/min/{1.73_m2} (ref >59)
Globulin, Total: 3.1 g/dL (ref 1.5–4.5)
Glucose: 96 mg/dL (ref 65–99)
Potassium: 3.5 mmol/L (ref 3.5–5.2)
Sodium: 139 mmol/L (ref 134–144)
Total Protein: 7.1 g/dL (ref 6.0–8.5)

## 2019-10-07 LAB — CBC WITH DIFFERENTIAL/PLATELET
Basophils Absolute: 0 10*3/uL (ref 0.0–0.2)
Basos: 0 %
EOS (ABSOLUTE): 0.2 10*3/uL (ref 0.0–0.4)
Eos: 4 %
Hematocrit: 34.3 % (ref 34.0–46.6)
Hemoglobin: 11.1 g/dL (ref 11.1–15.9)
Immature Grans (Abs): 0 10*3/uL (ref 0.0–0.1)
Immature Granulocytes: 1 %
Lymphocytes Absolute: 1.2 10*3/uL (ref 0.7–3.1)
Lymphs: 26 %
MCH: 25.8 pg — ABNORMAL LOW (ref 26.6–33.0)
MCHC: 32.4 g/dL (ref 31.5–35.7)
MCV: 80 fL (ref 79–97)
Monocytes Absolute: 0.9 10*3/uL (ref 0.1–0.9)
Monocytes: 19 %
Neutrophils Absolute: 2.4 10*3/uL (ref 1.4–7.0)
Neutrophils: 50 %
Platelets: 191 10*3/uL (ref 150–450)
RBC: 4.3 x10E6/uL (ref 3.77–5.28)
RDW: 15.3 % (ref 11.7–15.4)
WBC: 4.7 10*3/uL (ref 3.4–10.8)

## 2019-10-07 LAB — VITAMIN B12: Vitamin B-12: 657 pg/mL (ref 232–1245)

## 2019-10-07 LAB — TSH: TSH: 1.92 u[IU]/mL (ref 0.450–4.500)

## 2019-10-07 MED ORDER — ROPINIROLE HCL 0.5 MG PO TABS: 0.5000 mg | ORAL_TABLET | Freq: Every day | ORAL | 2 refills | Status: DC

## 2019-10-07 NOTE — Progress Notes (Signed)
d 

## 2019-10-19 ENCOUNTER — Telehealth: Payer: Self-pay | Admitting: Medical

## 2019-10-19 NOTE — Telephone Encounter (Signed)
Please call and see what she is experiencing with the medication?

## 2019-10-19 NOTE — Telephone Encounter (Signed)
Pt called and states that the Requip medication is too strong. Please advise pt at [phone]. pt uses [facility] [address]

## 2019-10-20 NOTE — Telephone Encounter (Signed)
Patient stated medication made her dizzy ans she felt like she was going to faint. She also felt very weak.

## 2019-10-20 NOTE — Telephone Encounter (Signed)
If she is willing to do this, have her try cutting these in half and doing 1/2 tablet daily for the next week to see if this helps without the side effects.  This is a tiny dose and is usually well tolerated.   If she doesn't want to do that let me know

## 2019-10-21 NOTE — Telephone Encounter (Signed)
Patient has agreed to cut pill in half

## 2020-03-30 ENCOUNTER — Other Ambulatory Visit: Payer: Self-pay

## 2020-03-30 ENCOUNTER — Encounter (INDEPENDENT_AMBULATORY_CARE_PROVIDER_SITE_OTHER): Payer: Self-pay | Admitting: Bariatrics

## 2020-03-30 ENCOUNTER — Ambulatory Visit (INDEPENDENT_AMBULATORY_CARE_PROVIDER_SITE_OTHER): Payer: Managed Care, Other (non HMO) | Admitting: Bariatrics

## 2020-03-30 VITALS — BP 154/109 | HR 78 | Temp 98.5°F | Ht 63.0 in | Wt 240.0 lb

## 2020-03-30 DIAGNOSIS — I10 Essential (primary) hypertension: Secondary | ICD-10-CM | POA: Diagnosis not present

## 2020-03-30 DIAGNOSIS — R5383 Other fatigue: Secondary | ICD-10-CM

## 2020-03-30 DIAGNOSIS — Z1331 Encounter for screening for depression: Secondary | ICD-10-CM | POA: Diagnosis not present

## 2020-03-30 DIAGNOSIS — M545 Low back pain: Secondary | ICD-10-CM

## 2020-03-30 DIAGNOSIS — Z9189 Other specified personal risk factors, not elsewhere classified: Secondary | ICD-10-CM | POA: Diagnosis not present

## 2020-03-30 DIAGNOSIS — Z6841 Body Mass Index (BMI) 40.0 and over, adult: Secondary | ICD-10-CM

## 2020-03-30 DIAGNOSIS — R0602 Shortness of breath: Secondary | ICD-10-CM

## 2020-03-30 DIAGNOSIS — Z0289 Encounter for other administrative examinations: Secondary | ICD-10-CM

## 2020-03-30 DIAGNOSIS — G8929 Other chronic pain: Secondary | ICD-10-CM

## 2020-03-31 ENCOUNTER — Encounter (INDEPENDENT_AMBULATORY_CARE_PROVIDER_SITE_OTHER): Payer: Self-pay | Admitting: Bariatrics

## 2020-03-31 DIAGNOSIS — R7303 Prediabetes: Secondary | ICD-10-CM | POA: Insufficient documentation

## 2020-03-31 DIAGNOSIS — E559 Vitamin D deficiency, unspecified: Secondary | ICD-10-CM | POA: Insufficient documentation

## 2020-03-31 LAB — INSULIN, RANDOM: INSULIN: 19 u[IU]/mL (ref 2.6–24.9)

## 2020-03-31 LAB — HEMOGLOBIN A1C
Est. average glucose Bld gHb Est-mCnc: 117 mg/dL
Hgb A1c MFr Bld: 5.7 % — ABNORMAL HIGH (ref 4.8–5.6)

## 2020-03-31 LAB — LIPID PANEL WITH LDL/HDL RATIO
Cholesterol, Total: 155 mg/dL (ref 100–199)
HDL: 44 mg/dL (ref >39)
LDL Chol Calc (NIH): 99 mg/dL (ref 0–99)
LDL/HDL Ratio: 2.3 ratio (ref 0.0–3.2)
Triglycerides: 60 mg/dL (ref 0–149)
VLDL Cholesterol Cal: 12 mg/dL (ref 5–40)

## 2020-03-31 LAB — COMPREHENSIVE METABOLIC PANEL
ALT: 10 IU/L (ref 0–32)
AST: 17 IU/L (ref 0–40)
Albumin/Globulin Ratio: 1.2 (ref 1.2–2.2)
Albumin: 4.1 g/dL (ref 3.8–4.9)
Alkaline Phosphatase: 71 IU/L (ref 48–121)
BUN/Creatinine Ratio: 11 (ref 9–23)
BUN: 7 mg/dL (ref 6–24)
Bilirubin Total: 0.3 mg/dL (ref 0.0–1.2)
CO2: 24 mmol/L (ref 20–29)
Calcium: 9.1 mg/dL (ref 8.7–10.2)
Chloride: 104 mmol/L (ref 96–106)
Creatinine, Ser: 0.64 mg/dL (ref 0.57–1.00)
GFR calc Af Amer: 115 mL/min/{1.73_m2} (ref >59)
GFR calc non Af Amer: 99 mL/min/{1.73_m2} (ref >59)
Globulin, Total: 3.4 g/dL (ref 1.5–4.5)
Glucose: 104 mg/dL — ABNORMAL HIGH (ref 65–99)
Potassium: 4.1 mmol/L (ref 3.5–5.2)
Sodium: 141 mmol/L (ref 134–144)
Total Protein: 7.5 g/dL (ref 6.0–8.5)

## 2020-03-31 LAB — VITAMIN D 25 HYDROXY (VIT D DEFICIENCY, FRACTURES): Vit D, 25-Hydroxy: 10 ng/mL — ABNORMAL LOW (ref 30.0–100.0)

## 2020-03-31 NOTE — Progress Notes (Signed)
Dear Dr.  Frederik White,   Thank you for referring Sonya White to our clinic. The following note includes my evaluation and treatment recommendations.  Chief Complaint:   OBESITY Sonya White (MR# [id number]) is a 59 y.o. female who presents for evaluation and treatment of obesity and related comorbidities. Current BMI is Body mass index is 42.51 kg/m.Marland Kitchen Sonya White has been struggling with her weight for many years and has been unsuccessful in either losing weight, maintaining weight loss, or reaching her healthy weight goal.  Sonya White is currently in the action stage of change and ready to dedicate time achieving and maintaining a healthier weight. Sonya White is interested in becoming our patient and working on intensive lifestyle modifications including (but not limited to) diet and exercise for weight loss.  Sonya White does not like to cook and struggles with not knowing what to cook. She sometimes skips breakfast.  Sonya White's habits were reviewed today and are as follows: Her family sometimes eats meals together, her desired weight loss is 90 lbs, she has been heavy most of her life, she started gaining weight with her pregnancies, her heaviest weight ever was 240 pounds, she craves beef, she snacks on pistachios in the evenings, she skips breakfast frequently, she is frequently drinking liquids with calories and she has binge eating behaviors.  Depression Screen Sonya White's Food and Mood (modified PHQ-9) score was 8.  Depression screen PHQ 2/9 03/30/2020  Decreased Interest 1  Down, Depressed, Hopeless 1  PHQ - 2 Score 2  Altered sleeping 1  Tired, decreased energy 2  Change in appetite 1  Feeling bad or failure about yourself  0  Trouble concentrating 1  Moving slowly or fidgety/restless 1  Suicidal thoughts 0  PHQ-9 Score 8  Difficult doing work/chores Somewhat difficult   Subjective:   Other fatigue. Sonya White denies daytime somnolence and admits to waking up still tired. Sonya White generally gets 5  hours of sleep per night, and states that she has generally restful sleep. Snoring is present. Apneic episodes are not present. Epworth Sleepiness Score is 4.  SOB (shortness of breath) on exertion. Sonya White notes increasing shortness of breath with certain activities and seems to be worsening over time with weight gain. She notes getting out of breath sooner with activity than she used to. This has gotten worse recently. Sonya White denies shortness of breath at rest or orthopnea.  Essential hypertension. Sonya White reports previously elevated blood pressure without hypertension. She is on no medication.  BP Readings from Last 3 Encounters:  03/30/20 (!) 154/109  10/06/19 140/80  09/02/18 (!) 170/94   Lab Results  Component Value Date   CREATININE 0.64 03/30/2020   CREATININE 0.69 10/06/2019   CREATININE 0.64 01/28/2015   Chronic right-sided low back pain, unspecified whether sciatica present. Sonya White takes Ibuprofen as needed.  Depression screening. Sonya White had a mildly positive depression screening with a PHQ-9 score of 8.  At risk for activity intolerance. Sonya White is at risk of exercise intolerance due to low back pain and right knee ACL.  Assessment/Plan:   Other fatigue. Sonya White does feel that her weight is causing her energy to be lower than it should be. Fatigue may be related to obesity, depression or many other causes. Labs will be ordered, and in the meanwhile, Sonya White will focus on self care including making healthy food choices, increasing physical activity and focusing on stress reduction. EKG 12-Lead, Comprehensive metabolic panel, Hemoglobin A1c, Insulin, random, Lipid Panel With LDL/HDL Ratio, VITAMIN D 25 Hydroxy (Vit-D  Deficiency, Fractures) testing ordered today.  SOB (shortness of breath) on exertion. Sonya White does feel that she gets out of breath more easily that she used to when she exercises. Sonya White's shortness of breath appears to be obesity related and exercise induced. She has agreed to  work on weight loss and gradually increase exercise to treat her exercise induced shortness of breath. Will continue to monitor closely. Lipid Panel With LDL/HDL Ratio ordered today.  Essential hypertension. Sonya White is working on healthy weight loss and exercise to improve blood pressure control. We will watch for signs of hypotension as she continues her lifestyle modifications. Will recheck blood pressure and check at home and will consider blood pressure medication.  Chronic right-sided low back pain, unspecified whether sciatica present. Sonya White will continue her medication as directed.  Depression screening. Sonya White had a positive depression screening. Depression is commonly associated with obesity and often results in emotional eating behaviors. We will monitor this closely and work on CBT to help improve the non-hunger eating patterns. Referral to Psychology may be required if no improvement is seen as she continues in our clinic.  At risk for activity intolerance. Sonya White was given approximately 15 minutes of exercise intolerance counseling today. She is 59 y.o. female and has risk factors exercise intolerance including obesity. We discussed intensive lifestyle modifications today with an emphasis on specific weight loss instructions and strategies. Sonya White will slowly increase activity as tolerated.  Repetitive spaced learning was employed today to elicit superior memory formation and behavioral change.  Class 3 severe obesity with serious comorbidity and body mass index (BMI) of 40.0 to 44.9 in adult, unspecified obesity type (Sonya White).  Sonya White is currently in the action stage of change and her goal is to continue with weight loss efforts. I recommend Sonya White begin the structured treatment plan as follows:  She has agreed to the Category 2 Plan + 100 calories.  She will work on meal planning and will stop all sugary drinks.  We independently reviewed with the patient labs from 10/06/2019 including CMP,  B12, CBC, glucose, and TSH.  Exercise goals: All adults should avoid inactivity. Some physical activity is better than none, and adults who participate in any amount of physical activity gain some health benefits.   Behavioral modification strategies: increasing lean protein intake, decreasing simple carbohydrates, increasing vegetables, increasing water intake, decreasing eating out, no skipping meals, meal planning and cooking strategies, keeping healthy foods in the home and planning for success.  She was informed of the importance of frequent follow-up visits to maximize her success with intensive lifestyle modifications for her multiple health conditions. She was informed we would discuss her lab results at her next visit unless there is a critical issue that needs to be addressed sooner. Sonya White agreed to keep her next visit at the agreed upon time to discuss these results.  Objective:   Blood pressure (!) 154/109, pulse 78, temperature 98.5 F (36.9 C), height 5\' 3"  (1.6 m), weight 240 lb (108.9 kg), SpO2 100 %. Body mass index is 42.51 kg/m.  EKG: Sinus  Rhythm with a rate of 75 BPM. Diffuse nonspecific T-abnormality. Otherwise normal.  Indirect Calorimeter completed today shows a VO2 of 246 and a REE of 1713.  Her calculated basal metabolic rate is 0354 thus her basal metabolic rate is better than expected.  General: Cooperative, alert, well developed, in no acute distress. HEENT: Conjunctivae and lids unremarkable. Cardiovascular: Regular rhythm.  Lungs: Normal work of breathing. Neurologic: No focal deficits.   Lab  Results  Component Value Date   CREATININE 0.69 10/06/2019   BUN 7 10/06/2019   NA 139 10/06/2019   K 3.5 10/06/2019   CL 101 10/06/2019   CO2 26 10/06/2019   Lab Results  Component Value Date   ALT 8 10/06/2019   AST 14 10/06/2019   ALKPHOS 62 10/06/2019   BILITOT 0.4 10/06/2019   No results found for: HGBA1C No results found for: INSULIN Lab Results    Component Value Date   TSH 1.920 10/06/2019   Lab Results  Component Value Date   CHOL 141 07/07/2013   HDL 42 07/07/2013   LDLCALC 89 07/07/2013   TRIG 52 07/07/2013   CHOLHDL 3.4 07/07/2013   Lab Results  Component Value Date   WBC 4.7 10/06/2019   HGB 11.1 10/06/2019   HCT 34.3 10/06/2019   MCV 80 10/06/2019   PLT 191 10/06/2019   No results found for: IRON, TIBC, FERRITIN  Attestation Statements:   Reviewed by clinician on day of visit: allergies, medications, problem list, medical history, surgical history, family history, social history, and previous encounter notes.  Migdalia Dk, am acting as Location manager for [employer], DO   I have reviewed the above documentation for accuracy and completeness, and I agree with the above. Jearld Lesch, DO

## 2020-04-13 ENCOUNTER — Ambulatory Visit (INDEPENDENT_AMBULATORY_CARE_PROVIDER_SITE_OTHER): Payer: Managed Care, Other (non HMO) | Admitting: Bariatrics

## 2020-04-13 ENCOUNTER — Encounter (INDEPENDENT_AMBULATORY_CARE_PROVIDER_SITE_OTHER): Payer: Self-pay | Admitting: Bariatrics

## 2020-04-13 ENCOUNTER — Other Ambulatory Visit: Payer: Self-pay

## 2020-04-13 VITALS — BP 157/103 | HR 84 | Temp 98.1°F | Ht 63.0 in | Wt 244.0 lb

## 2020-04-13 DIAGNOSIS — R7303 Prediabetes: Secondary | ICD-10-CM

## 2020-04-13 DIAGNOSIS — Z9189 Other specified personal risk factors, not elsewhere classified: Secondary | ICD-10-CM

## 2020-04-13 DIAGNOSIS — E559 Vitamin D deficiency, unspecified: Secondary | ICD-10-CM

## 2020-04-13 DIAGNOSIS — I10 Essential (primary) hypertension: Secondary | ICD-10-CM

## 2020-04-13 DIAGNOSIS — Z6841 Body Mass Index (BMI) 40.0 and over, adult: Secondary | ICD-10-CM

## 2020-04-13 MED ORDER — VITAMIN D (ERGOCALCIFEROL) 1.25 MG (50000 UNIT) PO CAPS: 50000.0000 [IU] | ORAL_CAPSULE | ORAL | 0 refills | Status: DC

## 2020-04-13 MED ORDER — CHLORTHALIDONE 25 MG PO TABS: 25.0000 mg | ORAL_TABLET | Freq: Every day | ORAL | 0 refills | Status: DC

## 2020-04-13 NOTE — Progress Notes (Signed)
Chief Complaint:   OBESITY Sonya White is here to discuss her progress with her obesity treatment plan along with follow-up of her obesity related diagnoses. Sonya White is on the Category 2 Plan + 100 calories and states she is following her eating plan approximately 30% of the time. Sonya White states she is exercising 0 minutes 0 times per week.  Today's visit was #: 2 Starting weight: 240 lbs Starting date: 03/30/2020 Today's weight: 244 lbs Today's date: 04/13/2020 Total lbs lost to date: 0 Total lbs lost since last in-office visit: 0  Interim History: Sonya White is up 4 lbs. Her weakness is "pistachios." She also struggles with breakfast as she gets up late.  Subjective:   Vitamin D deficiency. Sonya White is not on Vitamin D supplementation.    Ref. Range 03/30/2020 10:59  Vitamin D, 25-Hydroxy Latest Ref Range: 30.0 - 100.0 ng/mL 10.0 (L)   Prediabetes. Sonya White has a diagnosis of prediabetes based on her elevated HgA1c and was informed this puts her at greater risk of developing diabetes. She continues to work on diet and exercise to decrease her risk of diabetes. She denies nausea or hypoglycemia.  Lab Results  Component Value Date   HGBA1C 5.7 (H) 03/30/2020   Lab Results  Component Value Date   INSULIN 19.0 03/30/2020   Essential hypertension. Sonya White is on no blood pressure medication.   BP Readings from Last 3 Encounters:  04/13/20 (!) 157/103  03/30/20 (!) 154/109  10/06/19 140/80   Lab Results  Component Value Date   CREATININE 0.64 03/30/2020   CREATININE 0.69 10/06/2019   CREATININE 0.64 01/28/2015   At risk for dehydration. Sonya White is at risk for dehydration due to elevated blood pressure when started on blood pressure medication.  Assessment/Plan:   Vitamin D deficiency. Low Vitamin D level contributes to fatigue and are associated with obesity, breast, and colon cancer. She was given a prescription for Vitamin D, Ergocalciferol, (DRISDOL) 1.25 MG (50000 UNIT) CAPS  capsule every week #4 with 0 refills and will follow-up for routine testing of Vitamin D, at least 2-3 times per year to avoid over-replacement.   Prediabetes. Sonya White will continue to work on weight loss, exercise, increasing healthy fats and protein, and decreasing simple carbohydrates to help decrease the risk of diabetes.   Essential hypertension. Sonya White is working on healthy weight loss and exercise to improve blood pressure control. We will watch for signs of hypotension as she continues her lifestyle modifications. Prescription was given for chlorthalidone (HYGROTON) 25 MG tablet 1 PO daily #30 with 0 refills.  At risk for dehydration. Sonya White was given approximately 15 minutes dehydration prevention counseling today. Sonya White is at risk for dehydration due to weight loss and current medication(s). She was encouraged to hydrate and monitor fluid status to avoid dehydration as well as weight loss plateaus.   Class 3 severe obesity with serious comorbidity and body mass index (BMI) of 40.0 to 44.9 in adult, unspecified obesity type ([place]).  Sonya White is currently in the action stage of change. As such, her goal is to continue with weight loss efforts. She has agreed to the Category 2 Plan + 100 calories.   She will work on meal planning and intentional eating.   We reviewed with the patient labs from 03/30/2020 including CMP, lipids, Vitamin D, A1c, and insulin.  Exercise goals: All adults should avoid inactivity. Some physical activity is better than none, and adults who participate in any amount of physical activity gain some health benefits.  Behavioral modification strategies: increasing lean protein intake, decreasing simple carbohydrates, increasing vegetables, increasing water intake, decreasing eating out, no skipping meals, meal planning and cooking strategies, keeping healthy foods in the home and planning for success.  Sonya White has agreed to follow-up with our clinic in 2 weeks. She was informed  of the importance of frequent follow-up visits to maximize her success with intensive lifestyle modifications for her multiple health conditions.   Objective:   Blood pressure (!) 157/103, pulse 84, temperature 98.1 F (36.7 C), height 5\' 3"  (1.6 m), weight 244 lb (110.7 kg), SpO2 96 %. Body mass index is 43.22 kg/m.  General: Cooperative, alert, well developed, in no acute distress. HEENT: Conjunctivae and lids unremarkable. Cardiovascular: Regular rhythm.  Lungs: Normal work of breathing. Neurologic: No focal deficits.   Lab Results  Component Value Date   CREATININE 0.64 03/30/2020   BUN 7 03/30/2020   NA 141 03/30/2020   K 4.1 03/30/2020   CL 104 03/30/2020   CO2 24 03/30/2020   Lab Results  Component Value Date   ALT 10 03/30/2020   AST 17 03/30/2020   ALKPHOS 71 03/30/2020   BILITOT 0.3 03/30/2020   Lab Results  Component Value Date   HGBA1C 5.7 (H) 03/30/2020   Lab Results  Component Value Date   INSULIN 19.0 03/30/2020   Lab Results  Component Value Date   TSH 1.920 10/06/2019   Lab Results  Component Value Date   CHOL 155 03/30/2020   HDL 44 03/30/2020   LDLCALC 99 03/30/2020   TRIG 60 03/30/2020   CHOLHDL 3.4 07/07/2013   Lab Results  Component Value Date   WBC 4.7 10/06/2019   HGB 11.1 10/06/2019   HCT 34.3 10/06/2019   MCV 80 10/06/2019   PLT 191 10/06/2019   No results found for: IRON, TIBC, FERRITIN  Attestation Statements:   Reviewed by clinician on day of visit: allergies, medications, problem list, medical history, surgical history, family history, social history, and previous encounter notes.  Migdalia Dk, am acting as Location manager for [employer], DO   I have reviewed the above documentation for accuracy and completeness, and I agree with the above. Jearld Lesch, DO

## 2020-04-14 ENCOUNTER — Ambulatory Visit (INDEPENDENT_AMBULATORY_CARE_PROVIDER_SITE_OTHER): Payer: Managed Care, Other (non HMO) | Admitting: Bariatrics

## 2020-04-27 ENCOUNTER — Other Ambulatory Visit (INDEPENDENT_AMBULATORY_CARE_PROVIDER_SITE_OTHER): Payer: Self-pay | Admitting: Bariatrics

## 2020-04-27 DIAGNOSIS — I10 Essential (primary) hypertension: Secondary | ICD-10-CM

## 2020-04-27 DIAGNOSIS — E559 Vitamin D deficiency, unspecified: Secondary | ICD-10-CM

## 2020-05-02 ENCOUNTER — Ambulatory Visit (INDEPENDENT_AMBULATORY_CARE_PROVIDER_SITE_OTHER): Payer: Managed Care, Other (non HMO) | Admitting: Bariatrics

## 2020-05-02 ENCOUNTER — Other Ambulatory Visit: Payer: Self-pay

## 2020-05-02 ENCOUNTER — Encounter (INDEPENDENT_AMBULATORY_CARE_PROVIDER_SITE_OTHER): Payer: Self-pay | Admitting: Bariatrics

## 2020-05-02 VITALS — BP 141/85 | HR 80 | Temp 80.0°F | Ht 63.0 in | Wt 241.0 lb

## 2020-05-02 DIAGNOSIS — I10 Essential (primary) hypertension: Secondary | ICD-10-CM | POA: Diagnosis not present

## 2020-05-02 DIAGNOSIS — Z6841 Body Mass Index (BMI) 40.0 and over, adult: Secondary | ICD-10-CM

## 2020-05-02 DIAGNOSIS — E559 Vitamin D deficiency, unspecified: Secondary | ICD-10-CM

## 2020-05-03 ENCOUNTER — Encounter (INDEPENDENT_AMBULATORY_CARE_PROVIDER_SITE_OTHER): Payer: Self-pay | Admitting: Bariatrics

## 2020-05-03 NOTE — Progress Notes (Signed)
Chief Complaint:   OBESITY Sonya White is here to discuss her progress with her obesity treatment plan along with follow-up of her obesity related diagnoses. Sonya White is on the Category 2 Plan + 100 calories and states she is following her eating plan approximately 85% of the time. Sonya White states she is exercising 0 minutes 0 times per week.  Today's visit was #: 3 Starting weight: 240 lbs Starting date: 03/30/2020 Today's weight: 241 lbs Today's date: 05/02/2020 Total lbs lost to date: 0 Total lbs lost since last in-office visit: 3  Interim History: Sonya White is down an additional 3 lbs. She states that breakfast is the hardest for her to follow plan.  Subjective:   Essential hypertension. Sonya White was given chlorthalidone at her last visit, but has not started taking it. Blood pressure last visit was 157/103; today blood pressure is 141/85.  BP Readings from Last 3 Encounters:  05/02/20 (!) 141/85  04/13/20 (!) 157/103  03/30/20 (!) 154/109   Lab Results  Component Value Date   CREATININE 0.64 03/30/2020   CREATININE 0.69 10/06/2019   CREATININE 0.64 01/28/2015   Vitamin D deficiency. Sonya White is taking Vitamin D supplementation.    Ref. Range 03/30/2020 10:59  Vitamin D, 25-Hydroxy Latest Ref Range: 30.0 - 100.0 ng/mL 10.0 (L)   Assessment/Plan:   Essential hypertension. Sonya White is working on healthy weight loss and exercise to improve blood pressure control. We will watch for signs of hypotension as she continues her lifestyle modifications. Sonya White will not start the chlorthalidone at this time, but may in the future.  Vitamin D deficiency. Low Vitamin D level contributes to fatigue and are associated with obesity, breast, and colon cancer. She agrees to continue to take Vitamin D as directed and will follow-up for routine testing of Vitamin D, at least 2-3 times per year to avoid over-replacement.  Class 3 severe obesity with serious comorbidity and body mass index (BMI) of 40.0  to 44.9 in adult, unspecified obesity type ([place]).  Sonya White is currently in the action stage of change. As such, her goal is to continue with weight loss efforts. She has agreed to the Category 2 Plan + 100 calories.  She will work on meal planning and intentional eating. Protein Shake Comparisons were given; she will add a shake in the a.m.   Exercise goals: All adults should avoid inactivity. Some physical activity is better than none, and adults who participate in any amount of physical activity gain some health benefits.  Behavioral modification strategies: increasing lean protein intake, decreasing simple carbohydrates, increasing vegetables, increasing water intake, decreasing eating out, no skipping meals, meal planning and cooking strategies and keeping healthy foods in the home.  Sonya White has agreed to follow-up with our clinic in 2-3 weeks. She was informed of the importance of frequent follow-up visits to maximize her success with intensive lifestyle modifications for her multiple health conditions.   Objective:   Blood pressure (!) 141/85, pulse 80, temperature (!) 80 F (26.7 C), height 5\' 3"  (1.6 m), weight 241 lb (109.3 kg), SpO2 96 %. Body mass index is 42.69 kg/m.  General: Cooperative, alert, well developed, in no acute distress. HEENT: Conjunctivae and lids unremarkable. Cardiovascular: Regular rhythm.  Lungs: Normal work of breathing. Neurologic: No focal deficits.   Lab Results  Component Value Date   CREATININE 0.64 03/30/2020   BUN 7 03/30/2020   NA 141 03/30/2020   K 4.1 03/30/2020   CL 104 03/30/2020   CO2 24 03/30/2020  Lab Results  Component Value Date   ALT 10 03/30/2020   AST 17 03/30/2020   ALKPHOS 71 03/30/2020   BILITOT 0.3 03/30/2020   Lab Results  Component Value Date   HGBA1C 5.7 (H) 03/30/2020   Lab Results  Component Value Date   INSULIN 19.0 03/30/2020   Lab Results  Component Value Date   TSH 1.920 10/06/2019   Lab Results    Component Value Date   CHOL 155 03/30/2020   HDL 44 03/30/2020   LDLCALC 99 03/30/2020   TRIG 60 03/30/2020   CHOLHDL 3.4 07/07/2013   Lab Results  Component Value Date   WBC 4.7 10/06/2019   HGB 11.1 10/06/2019   HCT 34.3 10/06/2019   MCV 80 10/06/2019   PLT 191 10/06/2019   No results found for: IRON, TIBC, FERRITIN  Attestation Statements:   Reviewed by clinician on day of visit: allergies, medications, problem list, medical history, surgical history, family history, social history, and previous encounter notes.  Time spent on visit including pre-visit chart review and post-visit charting and care was 20 minutes.   Migdalia Dk, am acting as Location manager for [employer], DO   I have reviewed the above documentation for accuracy and completeness, and I agree with the above. Jearld Lesch, DO

## 2020-05-08 ENCOUNTER — Other Ambulatory Visit: Payer: Self-pay

## 2020-05-08 ENCOUNTER — Emergency Department (HOSPITAL_BASED_OUTPATIENT_CLINIC_OR_DEPARTMENT_OTHER)
Admission: EM | Admit: 2020-05-08 | Discharge: 2020-05-08 | Disposition: A | Discharge status: Home / Self Care | Payer: Managed Care, Other (non HMO) | Attending: Emergency Medicine | Admitting: Emergency Medicine

## 2020-05-08 ENCOUNTER — Encounter (HOSPITAL_BASED_OUTPATIENT_CLINIC_OR_DEPARTMENT_OTHER): Payer: Self-pay

## 2020-05-08 DIAGNOSIS — Y929 Unspecified place or not applicable: Secondary | ICD-10-CM | POA: Insufficient documentation

## 2020-05-08 DIAGNOSIS — Y939 Activity, unspecified: Secondary | ICD-10-CM | POA: Insufficient documentation

## 2020-05-08 DIAGNOSIS — S00561A Insect bite (nonvenomous) of lip, initial encounter: Secondary | ICD-10-CM | POA: Insufficient documentation

## 2020-05-08 DIAGNOSIS — Y999 Unspecified external cause status: Secondary | ICD-10-CM | POA: Insufficient documentation

## 2020-05-08 DIAGNOSIS — W57XXXA Bitten or stung by nonvenomous insect and other nonvenomous arthropods, initial encounter: Secondary | ICD-10-CM | POA: Diagnosis not present

## 2020-05-08 NOTE — ED Triage Notes (Signed)
Pt states that she was at a restaurant on Friday, found a bug in her food states that it stung her upper lip and caused it to feel numb. Pt here today because she reports that the area is still numb on her upper left lip. NAD, speaking in full sentences.

## 2020-05-08 NOTE — ED Provider Notes (Signed)
[facility] EMERGENCY DEPARTMENT Provider Note   CSN: [id number] Arrival date & time: 05/08/20  1533     History Chief Complaint  Patient presents with  . Insect Bite    Sonya White is a 59 y.o. female.  HPI   Patient presents to the emergency department with chief complaint of being bitten by a bug on her left upper lip yesterday.  Patient explains she got takeout and while she was eating it at home a bug crawled out from the food and bit her left upper lip.  She states the bug look like  a beetle.  Since being biten she states that her lip feels numb and swollen.  She is not taking any medication for it, denies swelling of her tongue, throat, difficulty breathing, swallowing her own secretions.  Patient has significant medical history of colitis, obesity.  Patient denies headache, fever, chills, shortness of breath, chest pain, abdominal pain, pedal edema.  Past Medical History:  Diagnosis Date  . Colitis   . Hip joint pain   . History of stomach ulcers   . Influenza vaccine refused 9/14  . Nocturnal enuresis    history of, resolved  . Obesity   . Urge incontinence   . Uterine fibroid    s/p hysterectomy  . Wears glasses     Patient Active Problem List   Diagnosis Date Noted  . Prediabetes 03/31/2020  . Vitamin D deficiency 03/31/2020  . Restless leg 10/06/2019  . Pain in inferior right lower extremity 10/06/2019  . Chronic right-sided low back pain with right-sided sciatica 10/06/2019  . Leg paresthesia 10/06/2019  . Elevated blood-pressure reading without diagnosis of hypertension 10/06/2019  . Trochanteric bursitis, right hip 02/11/2017  . Obesity 10/31/2011    Past Surgical History:  Procedure Laterality Date  . ABDOMINAL HYSTERECTOMY     due to fibroids; partial; has both ovaries  . CESAREAN SECTION    . COLONOSCOPY     pending 08/10/13, Dr. Collene Mares  . HIP SURGERY    . KNEE ARTHROSCOPY    . WISDOM TOOTH EXTRACTION       OB History     Gravida  2   Para  2   Term      Preterm      AB      Living        SAB      TAB      Ectopic      Multiple      Live Births              Family History  Problem Relation Age of Onset  . Cancer Mother 71       multiple myeloma 53yo; breast 57yo  . AAA (abdominal aortic aneurysm) Mother   . Glaucoma Father   . Sudden death Father   . Glaucoma Sister   . Heart disease Brother        thinks it was related to steroid abuse  . Diabetes Neg Hx   . Stroke Neg Hx   . Hypertension Neg Hx     Social History   Tobacco Use  . Smoking status: Never Smoker  . Smokeless tobacco: Never Used  Vaping Use  . Vaping Use: Never used  Substance Use Topics  . Alcohol use: Yes    Alcohol/week: 20.0 standard drinks    Types: 20 Shots of liquor per week    Comment: occasional  . Drug use: No  Home Medications Prior to Admission medications   Medication Sig Start Date End Date Taking? Authorizing Provider  chlorthalidone (HYGROTON) 25 MG tablet Take 1 tablet (25 mg total) by mouth daily. 04/13/20   Jearld Lesch A, DO  ibuprofen (ADVIL) 200 MG tablet Take 200 mg by mouth 3 (three) times daily. prn    [provider]  Iron-Vitamins (GERITOL COMPLETE PO) Take by mouth. One tblspn qd    [provider]  Vitamin D, Ergocalciferol, (DRISDOL) 1.25 MG (50000 UNIT) CAPS capsule Take 1 capsule (50,000 Units total) by mouth every 7 (seven) days. 04/13/20   Georgia Lopes, DO    Allergies    Oxycodone-acetaminophen  Review of Systems   Review of Systems  Constitutional: Negative for chills and fever.  HENT: Negative for congestion, sore throat, tinnitus and trouble swallowing.   Eyes: Negative for pain.  Respiratory: Negative for cough and shortness of breath.   Cardiovascular: Negative for chest pain.  Gastrointestinal: Negative for abdominal pain, diarrhea, nausea and vomiting.  Genitourinary: Negative for enuresis.  Musculoskeletal: Negative for back pain.   Skin: Negative for rash.  Neurological: Negative for dizziness and headaches.  Hematological: Does not bruise/bleed easily.    Physical Exam Updated Vital Signs BP (!) 158/89 (BP Location: Left Arm)   Pulse 99   Temp 98.8 F (37.1 C) (Oral)   Resp 18   Ht '5\' 3"'  (1.6 m)   Wt (!) 109.3 kg   SpO2 99%   BMI 42.69 kg/m   Physical Exam Vitals and nursing note reviewed.  Constitutional:      General: She is not in acute distress.    Appearance: She is not ill-appearing.  HENT:     Head: Normocephalic and atraumatic.     Nose: No congestion.     Mouth/Throat:     Mouth: Mucous membranes are moist.     Pharynx: Oropharynx is clear.     Comments: Oropharynx is visualized, tongue and uvula were both midline nonswollen, no erythema or exudates noted.  Patient was controlling her own secretions out any difficulty.  Patient's left upper lip had a small abrasion, it was nonerythematous, no drainage or discharge noted. Eyes:     General: No scleral icterus. Cardiovascular:     Rate and Rhythm: Normal rate and regular rhythm.     Pulses: Normal pulses.     Heart sounds: No murmur heard.  No friction rub. No gallop.   Pulmonary:     Effort: No respiratory distress.     Breath sounds: No stridor. No wheezing, rhonchi or rales.  Abdominal:     General: There is no distension.     Tenderness: There is no abdominal tenderness. There is no guarding.  Musculoskeletal:        General: No swelling.  Skin:    General: Skin is warm and dry.     Capillary Refill: Capillary refill takes less than 2 seconds.     Findings: No rash.  Neurological:     Mental Status: She is alert.  Psychiatric:        Mood and Affect: Mood normal.     ED Results / Procedures / Treatments   Labs (all labs ordered are listed, but only abnormal results are displayed) Labs Reviewed - No data to display  EKG None  Radiology No results found.  Procedures Procedures (including critical care  time)  Medications Ordered in ED Medications - No data to display  ED Course  I have  reviewed the triage vital signs and the nursing notes.  Pertinent labs & imaging results that were available during my care of the patient were reviewed by me and considered in my medical decision making (see chart for details).    MDM Rules/Calculators/A&P                          I have personally reviewed all imaging, labs and have interpreted them.  Unlikely patient suffering from anaphylactic shock as she is nontoxic-appearing, vital signs reassuring, there are no hives or other rashes noted on the patient's body.  Unlikely patient suffering from airway obstruction as oropharynx was clear, tongue and uvula were both midline, she was controlling her own secretions  without difficulty, she had no wheezing or stridor noted on exam.  Unlikely patient suffering from cellulitis as she has been biten less than 24 hours ago, there is no drainage, erythema, or discharge noted.  Likely patient has some swelling due to a reaction of being bitten by bug. recommend she takes H1 and H2 blockers.  Patient nontoxic-appearing, vital signs reassuring, benign physical exam further lab work and imaging were not indicated.  Patient appears to be resting calmly bed showing no acute signs stress.  Vital signs remained stable does not meet criteria to be admitted to the hospital.  Likely patient suffered a slight reaction from being bitten by a bug recommend she takes H1 and H2 blockers and follows up with her primary care in 2 weeks if symptoms do not fully resolve.  Patient discussed with attending who agrees with assessment and plan.  Patient was given at home care as well as strict return precautions.  Patient verbalized that she understood and agreed to plan. Final Clinical Impression(s) / ED Diagnoses Final diagnoses:  Insect bite of lip, initial encounter    Rx / DC Orders ED Discharge Orders    None        Marcello Fennel, PA-C 05/08/20 [place], [place], DO 05/08/20 1910

## 2020-05-08 NOTE — Discharge Instructions (Addendum)
You have been seen here for a bug bite, exam and and vital signs look reassuring.  I recommend that you take Benadryl or Claritin as well as Zantac once a day for the next 7 days this will help with swelling and inflammation.  You may also use over-the-counter pain medications like ibuprofen and Tylenol as needed please follow dosage and on the back of bottle.  If you are not fully back to normal in 2 weeks I recommend follow-up with your primary care provider.  I want to come back to the emergency department if you develop swelling of your tongue or throat, difficulty swallowing your own saliva, difficulty breathing, chest pain, abdominal pain, uncontrolled nausea, vomiting, diarrhea as the symptoms require further evaluation management.

## 2020-05-19 ENCOUNTER — Ambulatory Visit (INDEPENDENT_AMBULATORY_CARE_PROVIDER_SITE_OTHER): Payer: Self-pay | Admitting: Family Medicine

## 2020-05-19 ENCOUNTER — Other Ambulatory Visit: Payer: Self-pay

## 2020-05-19 ENCOUNTER — Encounter: Payer: Self-pay | Admitting: Family Medicine

## 2020-05-19 DIAGNOSIS — M1710 Unilateral primary osteoarthritis, unspecified knee: Secondary | ICD-10-CM

## 2020-05-19 NOTE — Progress Notes (Signed)
   Subjective:    Patient ID: Sonya White, female    DOB: Dec 22, 1960, 59 y.o.   MRN: [id number]  HPI She is here for consult concerning weight loss.  She apparently needs to lose several more pounds before she can be considered for knee surgery under [employer].  Apparently they are pushing her to get this done.  She states that they are telling her that they cannot keep her on disability indefinitely.  She is now going to medical weight loss and wellness and has been there for 3 weeks.  Prior to this she apparently did lose some weight on her own by making some dietary changes.  Her knee trouble does keep her from exercising regularly.  Her main issue today is to lose enough weight to qualify for the surgery and then be able to return to work.  She is interested in medication.  Apparently she knows somebody that use phentermine with good results.  Review of Systems     Objective:   Physical Exam Alert and in no distress.  Weight is recorded.       Assessment & Plan:  Morbid obesity ([place])  Arthritis of knee I explained that I am very comfortable with her continuing to go to medical weight loss and work with them on this.  I explained that she did not need both me and medical weight loss to take care of this.  Recommend that she discuss possible medication management with them.  I did explain that the traditional weight loss medications are really not a good way to achieve long-term goals. I did ask her to increase her physical activity with going to the pool and also walking but short increments of walking based on her pain.

## 2020-05-24 ENCOUNTER — Ambulatory Visit (INDEPENDENT_AMBULATORY_CARE_PROVIDER_SITE_OTHER): Payer: Managed Care, Other (non HMO) | Admitting: Bariatrics

## 2020-06-08 ENCOUNTER — Other Ambulatory Visit (INDEPENDENT_AMBULATORY_CARE_PROVIDER_SITE_OTHER): Payer: Self-pay | Admitting: Bariatrics

## 2020-06-08 DIAGNOSIS — E559 Vitamin D deficiency, unspecified: Secondary | ICD-10-CM

## 2020-06-10 ENCOUNTER — Other Ambulatory Visit (INDEPENDENT_AMBULATORY_CARE_PROVIDER_SITE_OTHER): Payer: Self-pay | Admitting: Bariatrics

## 2020-06-10 DIAGNOSIS — I10 Essential (primary) hypertension: Secondary | ICD-10-CM

## 2020-11-11 ENCOUNTER — Telehealth: Payer: Self-pay | Admitting: Medical

## 2020-11-11 NOTE — Telephone Encounter (Signed)
Dismissal letter in guarantor snapshot  °

## 2021-07-12 ENCOUNTER — Emergency Department (HOSPITAL_BASED_OUTPATIENT_CLINIC_OR_DEPARTMENT_OTHER): Payer: 59

## 2021-07-12 ENCOUNTER — Emergency Department (HOSPITAL_BASED_OUTPATIENT_CLINIC_OR_DEPARTMENT_OTHER)
Admission: EM | Admit: 2021-07-12 | Discharge: 2021-07-12 | Disposition: A | Discharge status: Home / Self Care | Payer: 59 | Attending: Emergency Medicine | Admitting: Emergency Medicine

## 2021-07-12 ENCOUNTER — Other Ambulatory Visit: Payer: Self-pay

## 2021-07-12 ENCOUNTER — Encounter (HOSPITAL_BASED_OUTPATIENT_CLINIC_OR_DEPARTMENT_OTHER): Payer: Self-pay | Admitting: Emergency Medicine

## 2021-07-12 DIAGNOSIS — U071 COVID-19: Secondary | ICD-10-CM | POA: Diagnosis not present

## 2021-07-12 DIAGNOSIS — R519 Headache, unspecified: Secondary | ICD-10-CM | POA: Diagnosis present

## 2021-07-12 DIAGNOSIS — E860 Dehydration: Secondary | ICD-10-CM | POA: Diagnosis not present

## 2021-07-12 LAB — CBC WITH DIFFERENTIAL/PLATELET
Abs Immature Granulocytes: 0.01 10*3/uL (ref 0.00–0.07)
Basophils Absolute: 0 10*3/uL (ref 0.0–0.1)
Basophils Relative: 0 %
Eosinophils Absolute: 0 10*3/uL (ref 0.0–0.5)
Eosinophils Relative: 1 %
HCT: 37.4 % (ref 36.0–46.0)
Hemoglobin: 12.6 g/dL (ref 12.0–15.0)
Immature Granulocytes: 0 %
Lymphocytes Relative: 27 %
Lymphs Abs: 1.2 10*3/uL (ref 0.7–4.0)
MCH: 26.8 pg (ref 26.0–34.0)
MCHC: 33.7 g/dL (ref 30.0–36.0)
MCV: 79.6 fL — ABNORMAL LOW (ref 80.0–100.0)
Monocytes Absolute: 0.7 10*3/uL (ref 0.1–1.0)
Monocytes Relative: 15 %
Neutro Abs: 2.7 10*3/uL (ref 1.7–7.7)
Neutrophils Relative %: 57 %
Platelets: 168 10*3/uL (ref 150–400)
RBC: 4.7 MIL/uL (ref 3.87–5.11)
RDW: 14.9 % (ref 11.5–15.5)
WBC: 4.7 10*3/uL (ref 4.0–10.5)
nRBC: 0 % (ref 0.0–0.2)

## 2021-07-12 LAB — URINALYSIS, ROUTINE W REFLEX MICROSCOPIC
Bilirubin Urine: NEGATIVE
Glucose, UA: NEGATIVE mg/dL
Hgb urine dipstick: NEGATIVE
Ketones, ur: NEGATIVE mg/dL
Leukocytes,Ua: NEGATIVE
Nitrite: NEGATIVE
Protein, ur: NEGATIVE mg/dL
Specific Gravity, Urine: 1.005 (ref 1.005–1.030)
pH: 6.5 (ref 5.0–8.0)

## 2021-07-12 LAB — COMPREHENSIVE METABOLIC PANEL
ALT: 16 U/L (ref 0–44)
AST: 28 U/L (ref 15–41)
Albumin: 3.9 g/dL (ref 3.5–5.0)
Alkaline Phosphatase: 45 U/L (ref 38–126)
Anion gap: 7 (ref 5–15)
BUN: 8 mg/dL (ref 6–20)
CO2: 27 mmol/L (ref 22–32)
Calcium: 8.7 mg/dL — ABNORMAL LOW (ref 8.9–10.3)
Chloride: 99 mmol/L (ref 98–111)
Creatinine, Ser: 0.69 mg/dL (ref 0.44–1.00)
GFR, Estimated: >60 mL/min (ref >60)
Glucose, Bld: 118 mg/dL — ABNORMAL HIGH (ref 70–99)
Potassium: 3.3 mmol/L — ABNORMAL LOW (ref 3.5–5.1)
Sodium: 133 mmol/L — ABNORMAL LOW (ref 135–145)
Total Bilirubin: 0.5 mg/dL (ref 0.3–1.2)
Total Protein: 8.2 g/dL — ABNORMAL HIGH (ref 6.5–8.1)

## 2021-07-12 LAB — RESP PANEL BY RT-PCR (FLU A&B, COVID) ARPGX2
Influenza A by PCR: NEGATIVE
Influenza B by PCR: NEGATIVE
SARS Coronavirus 2 by RT PCR: POSITIVE — AB

## 2021-07-12 LAB — MAGNESIUM: Magnesium: 1.9 mg/dL (ref 1.7–2.4)

## 2021-07-12 LAB — LIPASE, BLOOD: Lipase: 41 U/L (ref 11–51)

## 2021-07-12 MED ORDER — ACETAMINOPHEN 325 MG PO TABS
650.0000 mg | ORAL_TABLET | Freq: Once | ORAL | Status: AC
  Administered 2021-07-12: 650 mg via ORAL
  Filled 2021-07-12: qty 2

## 2021-07-12 MED ORDER — MAGNESIUM OXIDE -MG SUPPLEMENT 400 (240 MG) MG PO TABS
400.0000 mg | ORAL_TABLET | Freq: Once | ORAL | Status: AC
  Administered 2021-07-12: 400 mg via ORAL
  Filled 2021-07-12: qty 1

## 2021-07-12 MED ORDER — ALBUTEROL SULFATE HFA 108 (90 BASE) MCG/ACT IN AERS
2.0000 | INHALATION_SPRAY | Freq: Once | RESPIRATORY_TRACT | Status: AC
  Administered 2021-07-12: 2 via RESPIRATORY_TRACT
  Filled 2021-07-12: qty 6.7

## 2021-07-12 MED ORDER — NIRMATRELVIR/RITONAVIR (PAXLOVID)TABLET: 3.0000 | ORAL_TABLET | Freq: Two times a day (BID) | ORAL | 0 refills | Status: AC

## 2021-07-12 MED ORDER — POTASSIUM CHLORIDE 20 MEQ PO PACK
40.0000 meq | PACK | Freq: Once | ORAL | Status: AC
  Administered 2021-07-12: 40 meq via ORAL
  Filled 2021-07-12: qty 2

## 2021-07-12 MED ORDER — LACTATED RINGERS IV BOLUS
1000.0000 mL | Freq: Once | INTRAVENOUS | Status: AC
  Administered 2021-07-12: 1000 mL via INTRAVENOUS

## 2021-07-12 MED ORDER — MECLIZINE HCL 25 MG PO TABS
25.0000 mg | ORAL_TABLET | Freq: Once | ORAL | Status: AC
  Administered 2021-07-12: 25 mg via ORAL
  Filled 2021-07-12: qty 1

## 2021-07-12 MED ORDER — MECLIZINE HCL 25 MG PO TABS: 25.0000 mg | ORAL_TABLET | Freq: Three times a day (TID) | ORAL | 0 refills | Status: DC | PRN

## 2021-07-12 MED ORDER — ONDANSETRON 4 MG PO TBDP: 4.0000 mg | ORAL_TABLET | Freq: Three times a day (TID) | ORAL | 0 refills | Status: DC | PRN

## 2021-07-12 MED ORDER — ONDANSETRON HCL 4 MG/2ML IJ SOLN
4.0000 mg | Freq: Once | INTRAMUSCULAR | Status: AC
  Administered 2021-07-12: 4 mg via INTRAVENOUS
  Filled 2021-07-12: qty 2

## 2021-07-12 NOTE — ED Notes (Signed)
ED Provider at bedside. 

## 2021-07-12 NOTE — ED Triage Notes (Addendum)
Pt reports she work up this morning with n/v/d and lightheadedness. Also started having a cough and intermittent HA a few days ago; no fever. Pt denies any pain.

## 2021-07-12 NOTE — ED Notes (Addendum)
Patient transported to X-ray & CT °

## 2021-07-12 NOTE — ED Notes (Signed)
Bedside commode in room for patient convenience

## 2021-07-12 NOTE — ED Notes (Signed)
Pt transported to xray 

## 2021-07-12 NOTE — ED Provider Notes (Signed)
[facility] Provider Note   CSN: [id number] Arrival date & time: 07/12/21  0735     History Chief Complaint  Patient presents with   Emesis   Dizziness   Diarrhea    Sonya White is a 60 y.o. female.   Emesis Associated symptoms: cough, diarrhea and headaches   Associated symptoms: no abdominal pain, no arthralgias, no chills, no fever, no myalgias and no sore throat   Dizziness Associated symptoms: diarrhea, headaches, nausea and vomiting   Associated symptoms: no blood in stool, no chest pain, no palpitations, no shortness of breath and no weakness   Diarrhea Associated symptoms: headaches and vomiting   Associated symptoms: no abdominal pain, no arthralgias, no chills, no fever and no myalgias   Patient presents for nausea, vomiting, and diarrhea starting this morning.  Prior to the onset of the symptoms, patient did have a cough over the past 3 days.  She also endorses loss of taste and smell.  She has been checking her temperature at home.  T-max was 100.1 degrees.  She did take ibuprofen this morning.  This morning, with the GI symptoms, she has since developed dizziness.  She has a remote history of vertigo.  Current dizziness is worsened with laying flat.  She describes her emesis and diarrhea as watery.  She is unaware of any suspicious food intake.  She has mild epigastric pain that she attributes to the recent vomiting.  Currently, patient endorses mild nausea.  She does not have dizziness at rest but does develop it with certain movements, including laying flat.    Past Medical History:  Diagnosis Date   Colitis    Hip joint pain    History of stomach ulcers    Influenza vaccine refused 9/14   Nocturnal enuresis    history of, resolved   Obesity    Urge incontinence    Uterine fibroid    s/p hysterectomy   Wears glasses     Patient Active Problem List   Diagnosis Date Noted   Prediabetes 03/31/2020   Vitamin D deficiency  03/31/2020   Restless leg 10/06/2019   Pain in inferior right lower extremity 10/06/2019   Chronic right-sided low back pain with right-sided sciatica 10/06/2019   Leg paresthesia 10/06/2019   Elevated blood-pressure reading without diagnosis of hypertension 10/06/2019   Trochanteric bursitis, right hip 02/11/2017   Morbid obesity ([place]) 10/31/2011    Past Surgical History:  Procedure Laterality Date   ABDOMINAL HYSTERECTOMY     due to fibroids; partial; has both ovaries   CESAREAN SECTION     COLONOSCOPY     pending 08/10/13, Dr. Collene Mares   HIP SURGERY     KNEE ARTHROSCOPY     WISDOM TOOTH EXTRACTION       OB History     Gravida  2   Para  2   Term      Preterm      AB      Living         SAB      IAB      Ectopic      Multiple      Live Births              Family History  Problem Relation Age of Onset   Cancer Mother 99       multiple myeloma 38yo; breast 60yo   AAA (abdominal aortic aneurysm) Mother    Glaucoma Father  Sudden death Father    Glaucoma Sister    Heart disease Brother        thinks it was related to steroid abuse   Diabetes Neg Hx    Stroke Neg Hx    Hypertension Neg Hx     Social History   Tobacco Use   Smoking status: Never   Smokeless tobacco: Never  Vaping Use   Vaping Use: Never used  Substance Use Topics   Alcohol use: Yes    Alcohol/week: 20.0 standard drinks    Types: 20 Shots of liquor per week    Comment: occasional   Drug use: No    Home Medications Prior to Admission medications   Medication Sig Start Date End Date Taking? Authorizing Provider  ibuprofen (ADVIL) 200 MG tablet Take 200 mg by mouth 3 (three) times daily. prn   Yes [provider]  meclizine (ANTIVERT) 25 MG tablet Take 1 tablet (25 mg total) by mouth 3 (three) times daily as needed for dizziness. 07/12/21  Yes Godfrey Pick, MD  nirmatrelvir/ritonavir EUA (PAXLOVID) 20 x 150 MG & 10 x 100MG TABS Take 3 tablets by mouth 2 (two) times  daily for 5 days. Patient GFR is >60. Take nirmatrelvir (150 mg) two tablets twice daily for 5 days and ritonavir (100 mg) one tablet twice daily for 5 days. 07/12/21 07/17/21 Yes Godfrey Pick, MD  ondansetron (ZOFRAN ODT) 4 MG disintegrating tablet Take 1 tablet (4 mg total) by mouth every 8 (eight) hours as needed for up to 12 doses for nausea or vomiting. 07/12/21  Yes Godfrey Pick, MD  Apple Cid Vn-Grn Tea-Bit Or-Cr (APPLE CIDER VINEGAR PLUS) TABS Take by mouth. Patient not taking: Reported on 07/12/2021    [provider]  chlorthalidone (HYGROTON) 25 MG tablet Take 1 tablet (25 mg total) by mouth daily. Patient not taking: No sig reported 04/13/20   Jearld Lesch A, DO  Iron-Vitamins (GERITOL COMPLETE PO) Take by mouth. One tblspn qd    [provider]  Vitamin D, Ergocalciferol, (DRISDOL) 1.25 MG (50000 UNIT) CAPS capsule Take 1 capsule (50,000 Units total) by mouth every 7 (seven) days. Patient not taking: No sig reported 04/13/20   Jearld Lesch A, DO    Allergies    Oxycodone-acetaminophen  Review of Systems   Review of Systems  Constitutional:  Negative for activity change, chills and fever.  HENT:  Positive for congestion. Negative for ear pain, rhinorrhea, sore throat, trouble swallowing and voice change.   Eyes:  Negative for photophobia, pain, redness and visual disturbance.  Respiratory:  Positive for cough. Negative for chest tightness, shortness of breath, wheezing and stridor.   Cardiovascular:  Negative for chest pain, palpitations and leg swelling.  Gastrointestinal:  Positive for diarrhea, nausea and vomiting. Negative for abdominal distention, abdominal pain and blood in stool.  Genitourinary:  Negative for dysuria, flank pain, hematuria, pelvic pain and urgency.  Musculoskeletal:  Negative for arthralgias, back pain, joint swelling, myalgias and neck pain.  Skin:  Negative for color change and rash.  Neurological:  Positive for dizziness and headaches.  Negative for seizures, syncope, facial asymmetry, speech difficulty, weakness and numbness.  Hematological:  Does not bruise/bleed easily.  Psychiatric/Behavioral:  Negative for confusion and decreased concentration.   All other systems reviewed and are negative.  Physical Exam Updated Vital Signs BP 132/72   Pulse 78   Temp 97.8 F (36.6 C) (Oral)   Resp 14   SpO2 98%   Physical Exam Vitals  and nursing note reviewed.  Constitutional:      General: She is not in acute distress.    Appearance: Normal appearance. She is well-developed. She is not ill-appearing, toxic-appearing or diaphoretic.  HENT:     Head: Normocephalic and atraumatic.     Right Ear: External ear normal.     Left Ear: External ear normal.     Nose: Congestion present.     Mouth/Throat:     Mouth: Mucous membranes are moist.     Pharynx: Oropharynx is clear.  Eyes:     General: No visual field deficit or scleral icterus.    Extraocular Movements: Extraocular movements intact.     Conjunctiva/sclera: Conjunctivae normal.  Cardiovascular:     Rate and Rhythm: Normal rate and regular rhythm.     Heart sounds: No murmur heard. Pulmonary:     Effort: Pulmonary effort is normal. No respiratory distress.     Breath sounds: Normal breath sounds. No wheezing or rales.  Abdominal:     Palpations: Abdomen is soft.     Tenderness: There is no abdominal tenderness. There is no right CVA tenderness or left CVA tenderness.  Musculoskeletal:        General: Normal range of motion.     Cervical back: Normal range of motion and neck supple. No rigidity or tenderness.     Right lower leg: No edema.     Left lower leg: No edema.  Skin:    General: Skin is warm and dry.     Capillary Refill: Capillary refill takes less than 2 seconds.     Coloration: Skin is not jaundiced or pale.  Neurological:     General: No focal deficit present.     Mental Status: She is alert and oriented to person, place, and time.     Cranial  Nerves: Cranial nerves are intact. No cranial nerve deficit, dysarthria or facial asymmetry.     Sensory: Sensation is intact. No sensory deficit.     Motor: Motor function is intact. No weakness, abnormal muscle tone or pronator drift.     Coordination: Coordination is intact. Coordination normal. Finger-Nose-Finger Test normal.  Psychiatric:        Mood and Affect: Mood normal.        Behavior: Behavior normal.    ED Results / Procedures / Treatments   Labs (all labs ordered are listed, but only abnormal results are displayed) Labs Reviewed  RESP PANEL BY RT-PCR (FLU A&B, COVID) ARPGX2 - Abnormal; Notable for the following components:      Result Value   SARS Coronavirus 2 by RT PCR POSITIVE (*)    All other components within normal limits  COMPREHENSIVE METABOLIC PANEL - Abnormal; Notable for the following components:   Sodium 133 (*)    Potassium 3.3 (*)    Glucose, Bld 118 (*)    Calcium 8.7 (*)    Total Protein 8.2 (*)    All other components within normal limits  CBC WITH DIFFERENTIAL/PLATELET - Abnormal; Notable for the following components:   MCV 79.6 (*)    All other components within normal limits  URINALYSIS, ROUTINE W REFLEX MICROSCOPIC - Abnormal; Notable for the following components:   APPearance HAZY (*)    All other components within normal limits  LIPASE, BLOOD  MAGNESIUM    EKG EKG Interpretation  Date/Time:  Wednesday July 12 2021 07:47:03 EDT Ventricular Rate:  75 PR Interval:  155 QRS Duration: 161 QT Interval:  394 QTC  Calculation: 441 R Axis:   32 Text Interpretation: Sinus rhythm LVH with secondary repolarization abnormality Confirmed by Godfrey Pick [phone]) on 07/12/2021 7:51:08 AM  Radiology CT HEAD WO CONTRAST (5MM)  Result Date: 07/12/2021 CLINICAL DATA:  Headache, intracranial hemorrhage suspected; dizziness, nonspecific. Additional history provided: Patient reports awakening this morning with nausea/vomiting/diarrhea and  lightheadedness. Recent cough and intermittent headache. EXAM: CT HEAD WITHOUT CONTRAST TECHNIQUE: Contiguous axial images were obtained from the base of the skull through the vertex without intravenous contrast. COMPARISON:  No pertinent prior exams available for comparison. FINDINGS: Brain: Cerebral volume is normal. Mild patchy and ill-defined hypoattenuation within the cerebral white matter, nonspecific but most often secondary to chronic small vessel ischemia. There is no acute intracranial hemorrhage. No demarcated cortical infarct. No extra-axial fluid collection. No evidence of an intracranial mass. No midline shift. Partially empty sella turcica. Vascular: No hyperdense vessel.  Atherosclerotic calcifications. Skull: Normal. Negative for fracture or focal lesion. Sinuses/Orbits: Visualized orbits show no acute finding. Trace mucosal thickening within the left sphenoid sinus at the imaged levels. IMPRESSION: No evidence of acute intracranial abnormality. Mild patchy and ill-defined hypoattenuation within the cerebral white matter, nonspecific but most often secondary to chronic small vessel ischemia. Partially empty sella turcica. This finding is very commonly incidental, but can be associated with idiopathic intracranial hypertension. Trace mucosal thickening within the left sphenoid sinus. Electronically Signed   By: Kellie Simmering D.O.   On: 07/12/2021 08:38   DG Chest Portable 1 View  Result Date: 07/12/2021 CLINICAL DATA:  Cough. Additional provided: Patient reports awakening this morning with nausea/vomiting/dizziness and lightheadedness. Recent cough and intermittent headache. EXAM: PORTABLE CHEST 1 VIEW COMPARISON:  Prior chest radiographs 01/28/2015 and earlier. FINDINGS: Borderline cardiomegaly. No appreciable airspace consolidation or pulmonary edema. No evidence of pleural effusion or pneumothorax. No acute bony abnormality identified. Midthoracic levocurvature. IMPRESSION: Borderline  cardiomegaly. No appreciable airspace consolidation or pulmonary edema. Electronically Signed   By: Kellie Simmering D.O.   On: 07/12/2021 08:39    Procedures Procedures   Medications Ordered in ED Medications  lactated ringers bolus 1,000 mL (0 mLs Intravenous Stopped 07/12/21 0951)  ondansetron (ZOFRAN) injection 4 mg (4 mg Intravenous Given 07/12/21 0843)  meclizine (ANTIVERT) tablet 25 mg (25 mg Oral Given 07/12/21 0845)  albuterol (VENTOLIN HFA) 108 (90 Base) MCG/ACT inhaler 2 puff (2 puffs Inhalation Given 07/12/21 0838)  acetaminophen (TYLENOL) tablet 650 mg (650 mg Oral Given 07/12/21 0845)  potassium chloride (KLOR-CON) packet 40 mEq (40 mEq Oral Given 07/12/21 1053)  magnesium oxide (MAG-OX) tablet 400 mg (400 mg Oral Given 07/12/21 1053)    ED Course  I have reviewed the triage vital signs and the nursing notes.  Pertinent labs & imaging results that were available during my care of the patient were reviewed by me and considered in my medical decision making (see chart for details).    MDM Rules/Calculators/A&P                          Patient presents for 3 days of cough and the development of nausea, vomiting, diarrhea, and dizziness this morning.  Normal vital signs upon arrival.  Patient is overall well-appearing.  She has no focal neurologic deficits.  Symptoms are consistent with URI, possibly COVID.  Will obtain COVID and flu testing.  Lab work to be obtained to assess for electrolyte abnormalities.  Patient will be given IV fluid replacement.  Additionally, meclizine and Zofran ordered for symptomatic  relief.  Low suspicion for intracranial etiology but will obtain CT head to rule out.  Following work-up results, symptoms do appear to be secondary to COVID-19 infection.  She did have improvement following IV fluids, meclizine, and Zofran.  She was able to tolerate p.o. intake.  She was able to ambulate with a steady gait.  Potassium and magnesium ordered for electrolyte  optimization.  Patient was given prescriptions for as needed meclizine and Zofran at home.  I did speak with her as well about Paxlovid.  Currently she is on no home medications that would interact.  Kidney function is normal.  She was in favor of initiating this therapy.  Prescription was provided.  Patient was discharged in good condition.  Final Clinical Impression(s) / ED Diagnoses Final diagnoses:  Dehydration  COVID-19    Rx / DC Orders ED Discharge Orders          Ordered    meclizine (ANTIVERT) 25 MG tablet  3 times daily PRN        07/12/21 1040    ondansetron (ZOFRAN ODT) 4 MG disintegrating tablet  Every 8 hours PRN        07/12/21 1040    nirmatrelvir/ritonavir EUA (PAXLOVID) 20 x 150 MG & 10 x 100MG TABS  2 times daily        07/12/21 1040             Godfrey Pick, MD 07/13/21 1610

## 2022-05-23 ENCOUNTER — Encounter (INDEPENDENT_AMBULATORY_CARE_PROVIDER_SITE_OTHER): Payer: Self-pay

## 2022-11-27 IMAGING — CT CT HEAD W/O CM
3 series · 14 of 47 positions shown, 16 images · non-contrast
Comparison: No pertinent prior exams available for comparison.

CLINICAL DATA: Headache, intracranial hemorrhage suspected;
dizziness, nonspecific. Additional history provided: Patient reports
awakening this morning with nausea/vomiting/diarrhea and
lightheadedness. Recent cough and intermittent headache.

EXAM:
CT HEAD WITHOUT CONTRAST
TECHNIQUE: Contiguous axial images were obtained from the base of the skull
through the vertex without intravenous contrast.

[Series 2: head wo · axial · 0.44mm/px · z∈[-169,-44]mm · 8 of 30 slices shown, 10 images]
[im 3/30  brain]
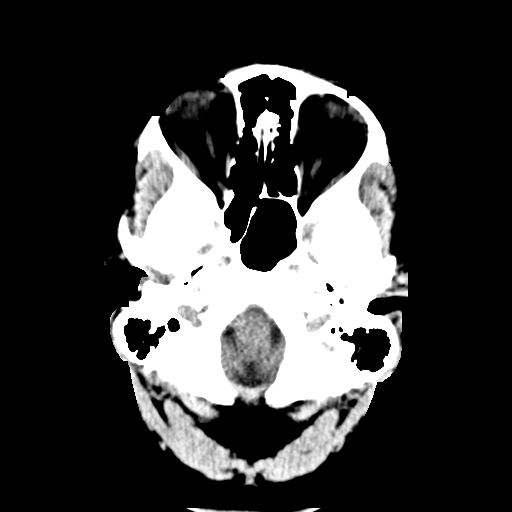
[im 3/30  bone]
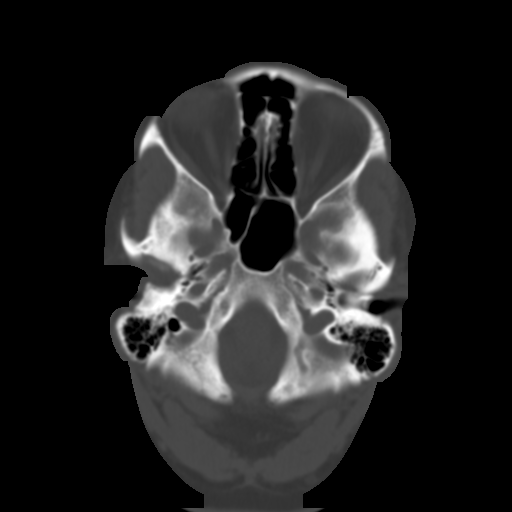
[im 7/30  brain]
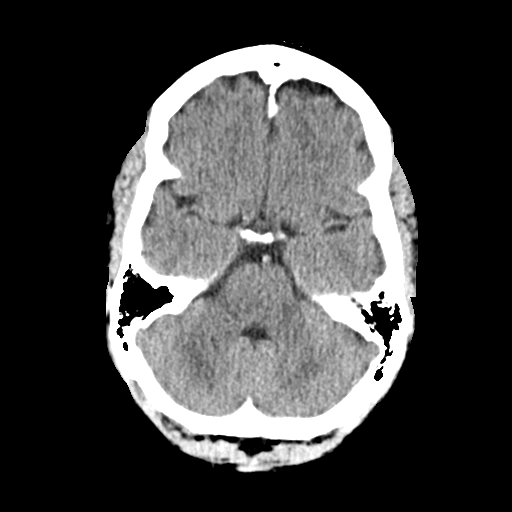
[im 10/30  brain]
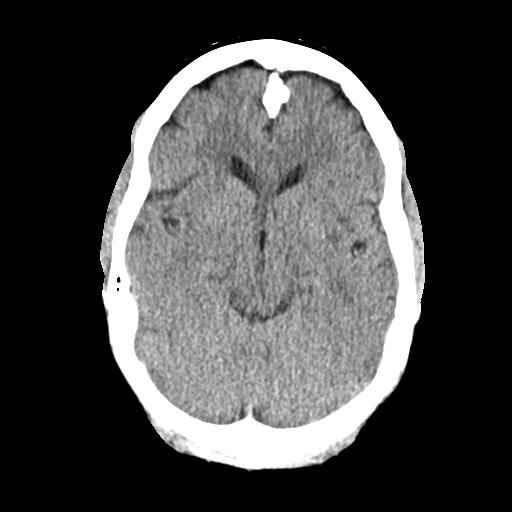
[im 14/30  brain]
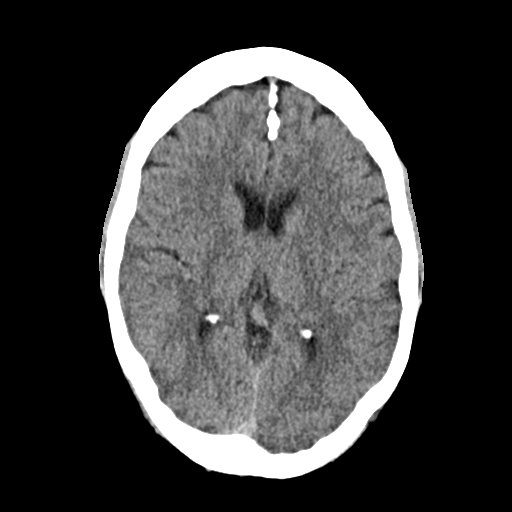
[im 17/30  brain]
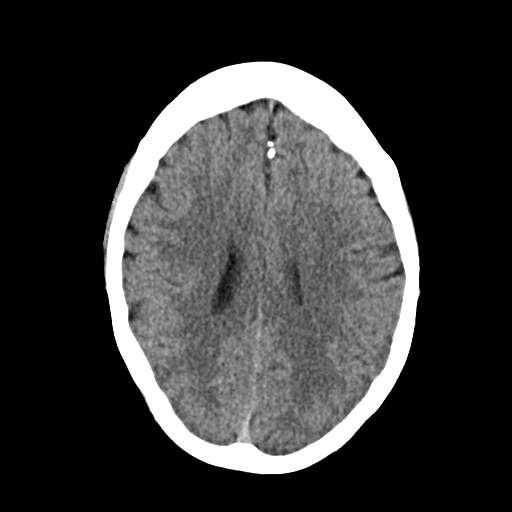
[im 17/30  bone]
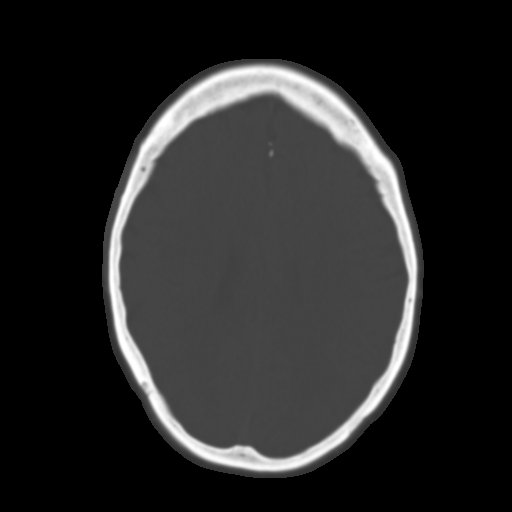
[im 21/30  brain]
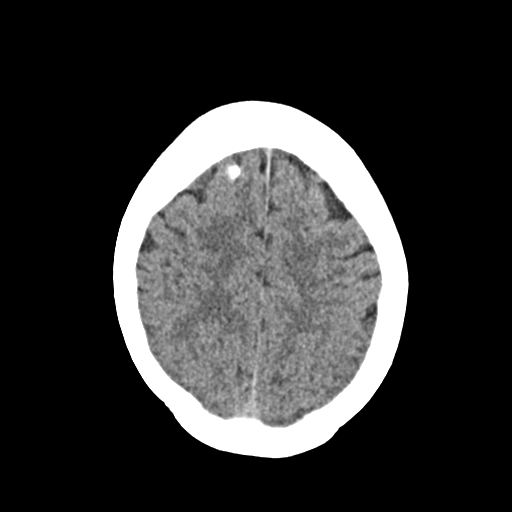
[im 24/30  brain]
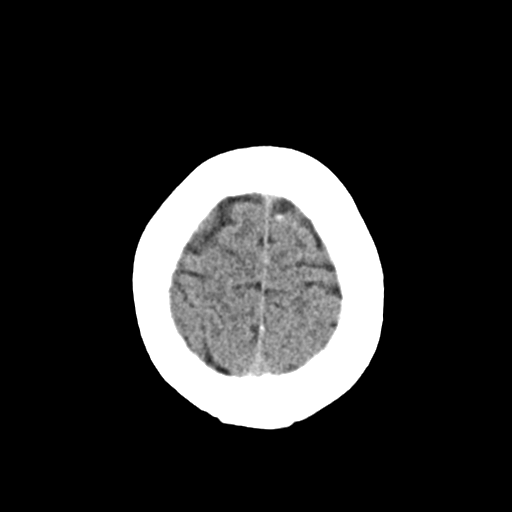
[im 28/30  brain]
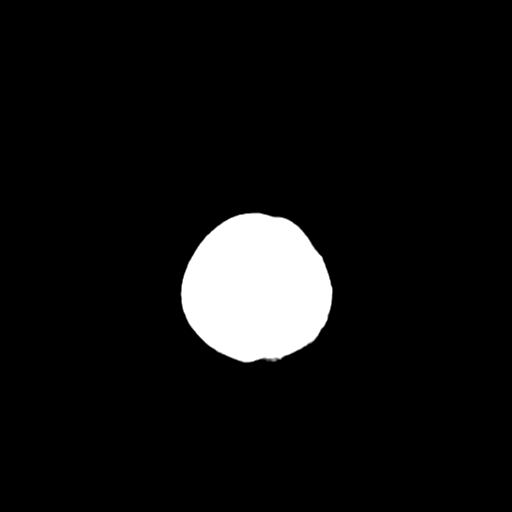

[Series 4: coronal soft · coronal · 0.29mm/px · 3 of 67 slices shown]
[im 23/67  brain]
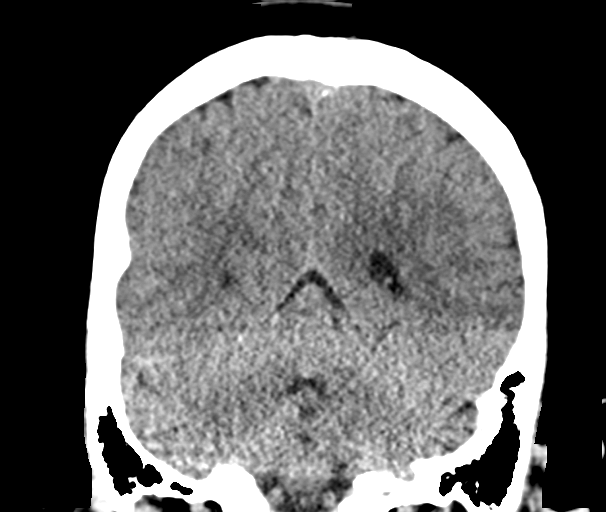
[im 30/67  brain]
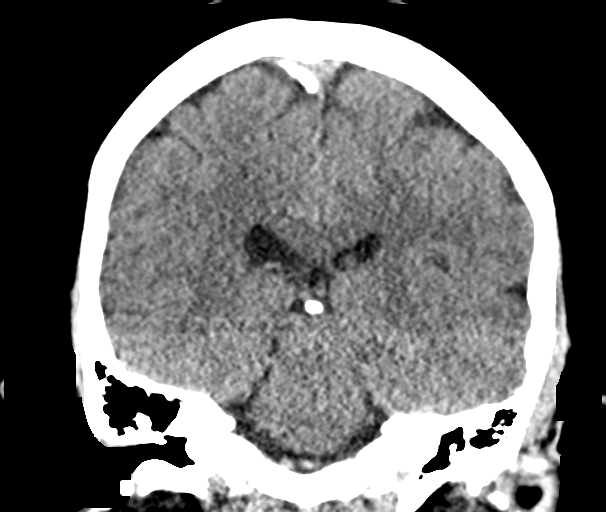
[im 37/67  brain]
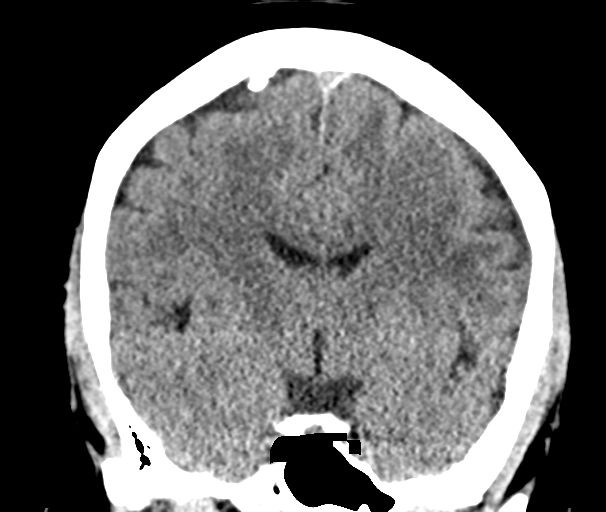

[Series 5: sag soft · sagittal · 0.29mm/px · 3 of 55 slices shown]
[im 19/55  brain]
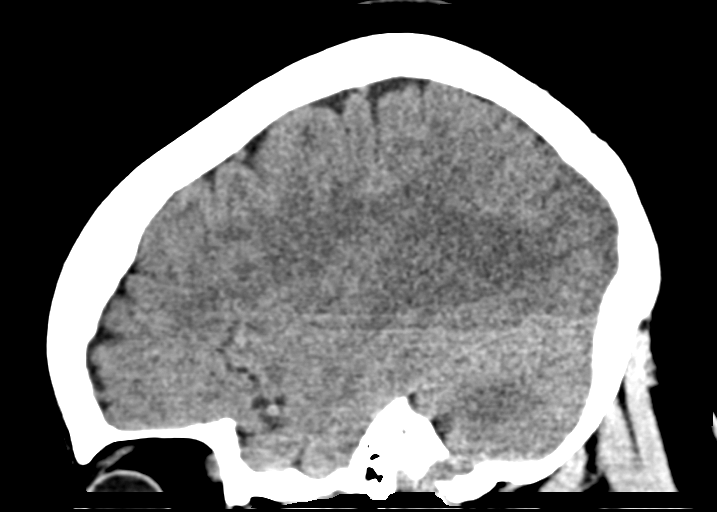
[im 28/55  brain]
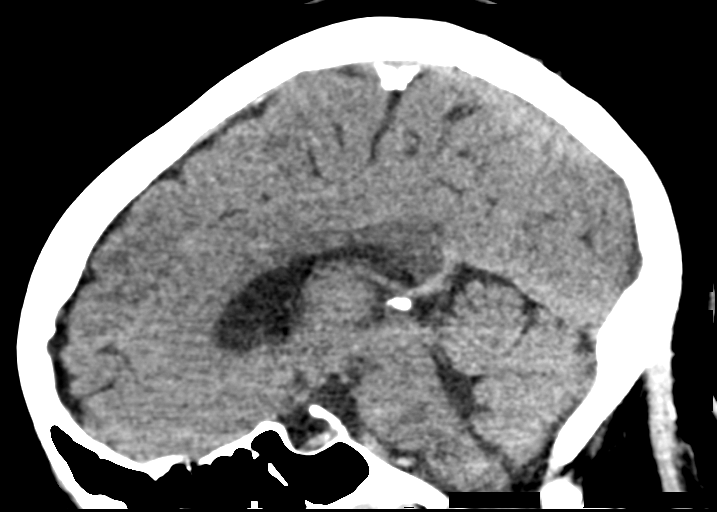
[im 37/55  brain]
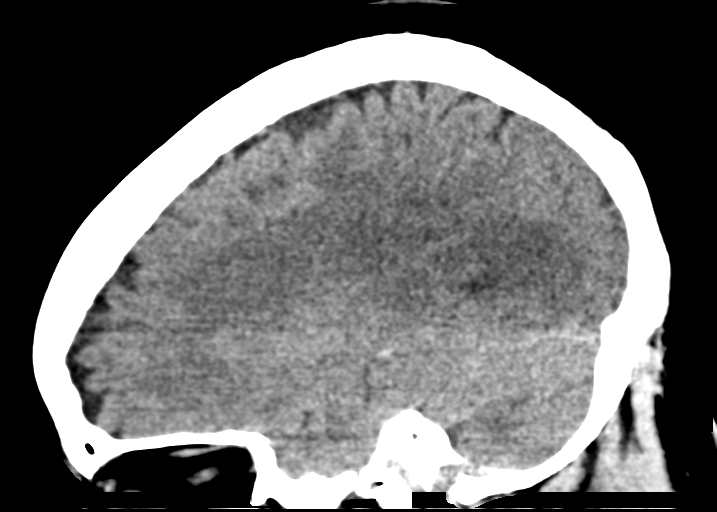

[14 of 47 positions shown; findings below may reference images not displayed]

FINDINGS: Brain:

Cerebral volume is normal.

Mild patchy and ill-defined hypoattenuation within the cerebral
white matter, nonspecific but most often secondary to chronic small
vessel ischemia.

There is no acute intracranial hemorrhage.

No demarcated cortical infarct.

No extra-axial fluid collection.

No evidence of an intracranial mass.

No midline shift.

Partially empty sella turcica.

Vascular: No hyperdense vessel.  Atherosclerotic calcifications.

Skull: Normal. Negative for fracture or focal lesion.

Sinuses/Orbits: Visualized orbits show no acute finding. Trace
mucosal thickening within the left sphenoid sinus at the imaged
levels.
IMPRESSION: No evidence of acute intracranial abnormality.

Mild patchy and ill-defined hypoattenuation within the cerebral
white matter, nonspecific but most often secondary to chronic small
vessel ischemia.

Partially empty sella turcica. This finding is very commonly
incidental, but can be associated with idiopathic intracranial
hypertension.

Trace mucosal thickening within the left sphenoid sinus.

## 2023-04-24 ENCOUNTER — Other Ambulatory Visit: Payer: Self-pay

## 2023-04-24 ENCOUNTER — Telehealth: Payer: Self-pay

## 2023-04-24 DIAGNOSIS — N6310 Unspecified lump in the right breast, unspecified quadrant: Secondary | ICD-10-CM

## 2023-04-24 NOTE — Telephone Encounter (Signed)
Lowella Bandy, Statistician at [employer] [place], called and stated that patient's daughter [phone]), called and stated that patient woke up today with an L-shaped bruise underneath, looks like a hematoma, does not take blood thinners, no accident. I called and spoke with patient's daughter, Nicolette Bang, who then conferenced in patient, who stated she woke up today with an L-shaped bruise, looks like a hematoma, underneath Right breast, "small knot", no pain, denies any pain, warmness of area, nipple discharge, or nipple inversion. Patient states her insurance does not start until 05/16/2023. Patient does meet all income guidelines for [facility]. Information given to scheduler. Per Fonnie Mu, RN, can be seen on 04/30/2023.

## 2023-04-30 ENCOUNTER — Ambulatory Visit: Payer: Self-pay | Admitting: *Deleted

## 2023-04-30 VITALS — BP 142/96 | Wt 246.8 lb

## 2023-04-30 DIAGNOSIS — Z1211 Encounter for screening for malignant neoplasm of colon: Secondary | ICD-10-CM

## 2023-04-30 DIAGNOSIS — N6315 Unspecified lump in the right breast, overlapping quadrants: Secondary | ICD-10-CM

## 2023-04-30 DIAGNOSIS — N644 Mastodynia: Secondary | ICD-10-CM

## 2023-04-30 DIAGNOSIS — Z1239 Encounter for other screening for malignant neoplasm of breast: Secondary | ICD-10-CM

## 2023-04-30 NOTE — Patient Instructions (Signed)
Explained breast self awareness with Marjean Donna. Patient did not need a Pap smear today due to patient has a history of a hysterectomy for benign reasons. Let patient know that she doesn't need any further Pap smears due to her history of a hysterectomy for benign reasons. Referred patient to the [facility] for diagnostic mammogram. Appointment scheduled for Thursday, May 02, 2023 at 0940. Patient aware of appointment and will be there. ARNETT GALINDEZ verbalized understanding.  Tsugio Elison, Kathaleen Maser, RN 9:09 AM

## 2023-04-30 NOTE — Progress Notes (Signed)
Attestation of Supervision of Student:  I confirm that I have verified the information documented in the nurse practitioner student's note and that I have also personally performed the history, physical exam and all medical decision making activities.  I have verified that all services and findings are accurately documented in this student's note; and I agree with management and plan as outlined in the documentation. I have also made any necessary editorial changes.  Timiya Howells, Kathaleen Maser, RN [facility] [employer], [facility] 04/30/2023 10:10 AM

## 2023-04-30 NOTE — Progress Notes (Signed)
[name] is a 62 y.o. female who presents to [facility] clinic today with complaint of tenderness and blue/purple discoloration on right breast with appeared about 2 weeks ago. Denies any trauma to the area.    Pap Smear: Pap not smear completed today. Last Pap smear was 2006 at [facility] and was normal. Per patient has no history of an abnormal Pap smear. Last Pap smear result is not available in Epic.   Physical exam: Breasts Breasts asymmetrical with right breast slightly larger than left breast. No skin abnormalities bilateral breasts. No nipple retraction bilateral breasts. No nipple discharge bilateral breasts. No lymphadenopathy. Small lump palpated on right breast at 9-10 o'clock with tenderness noted 6 cm from nipple. Left breast tenderness noted at 5 o'clock with 3 cm from breast.         Pelvic/Bimanual Pap is not indicated today. Patient reports having hysterectomy in 2002 for fibroids and abnormal uterine bleeding.   Smoking History: Patient has never smoked. Not referred to quit line.    Patient Navigation: Patient education provided. Access to services provided for patient through [facility] program. No interpreter provided. No transportation provided   Colorectal Cancer Screening: Per patient has had colonoscopy completed on 2014.  Education provided on next Colonoscopy at 10 years after last procedure. Colorectal FIT test given to complete and mail. No complaints today.    Breast and Cervical Cancer Risk Assessment: Patient has family history of breast cancer bother mother and maternal grandmother. No known genetic mutations, or radiation treatment to the chest before age 46. Patient does not have history of cervical dysplasia, immunocompromised, or DES exposure in-utero.  Risk Scores as of Encounter on 04/30/2023     [name]           5-year 1.44%   Lifetime 6.89%            Last calculated by Meryl Dare, CMA on 04/30/2023 at  9:05 AM         A: [facility] exam without pap smear Complaint of breast lump and tenderness on right breast.   P: Referred patient to the [facility] for a diagnostic mammogram. [facility]/2023 at 940 am.  Joette Catching, RN 04/30/2023 9:34 AM

## 2023-05-02 ENCOUNTER — Ambulatory Visit
Admission: RE | Admit: 2023-05-02 | Discharge: 2023-05-02 | Disposition: A | Discharge status: Home / Self Care | Payer: No Typology Code available for payment source | Source: Ambulatory Visit | Attending: Obstetrics and Gynecology | Admitting: Obstetrics and Gynecology

## 2023-05-02 ENCOUNTER — Ambulatory Visit
Admission: RE | Admit: 2023-05-02 | Discharge: 2023-05-02 | Disposition: A | Discharge status: Home / Self Care | Payer: Self-pay | Source: Ambulatory Visit | Attending: Obstetrics and Gynecology | Admitting: Obstetrics and Gynecology

## 2023-05-02 DIAGNOSIS — N6310 Unspecified lump in the right breast, unspecified quadrant: Secondary | ICD-10-CM

## 2023-07-19 ENCOUNTER — Emergency Department (HOSPITAL_BASED_OUTPATIENT_CLINIC_OR_DEPARTMENT_OTHER)
Admission: EM | Admit: 2023-07-19 | Discharge: 2023-07-19 | Disposition: A | Discharge status: Home / Self Care | Payer: Commercial Managed Care - HMO | Attending: Emergency Medicine | Admitting: Emergency Medicine

## 2023-07-19 ENCOUNTER — Other Ambulatory Visit: Payer: Self-pay

## 2023-07-19 ENCOUNTER — Emergency Department (HOSPITAL_BASED_OUTPATIENT_CLINIC_OR_DEPARTMENT_OTHER): Payer: Commercial Managed Care - HMO

## 2023-07-19 ENCOUNTER — Encounter (HOSPITAL_BASED_OUTPATIENT_CLINIC_OR_DEPARTMENT_OTHER): Payer: Self-pay | Admitting: Pediatrics

## 2023-07-19 DIAGNOSIS — M542 Cervicalgia: Secondary | ICD-10-CM

## 2023-07-19 DIAGNOSIS — R519 Headache, unspecified: Secondary | ICD-10-CM | POA: Insufficient documentation

## 2023-07-19 LAB — COMPREHENSIVE METABOLIC PANEL
ALT: 15 U/L (ref 0–44)
AST: 19 U/L (ref 15–41)
Albumin: 4 g/dL (ref 3.5–5.0)
Alkaline Phosphatase: 49 U/L (ref 38–126)
Anion gap: 10 (ref 5–15)
BUN: 10 mg/dL (ref 6–20)
CO2: 26 mmol/L (ref 22–32)
Calcium: 8.9 mg/dL (ref 8.9–10.3)
Chloride: 102 mmol/L (ref 98–111)
Creatinine, Ser: 0.73 mg/dL (ref 0.44–1.00)
GFR, Estimated: >60 mL/min (ref >60)
Glucose, Bld: 98 mg/dL (ref 70–99)
Potassium: 3.5 mmol/L (ref 3.5–5.1)
Sodium: 138 mmol/L (ref 135–145)
Total Bilirubin: 0.6 mg/dL (ref 0.3–1.2)
Total Protein: 8 g/dL (ref 6.5–8.1)

## 2023-07-19 LAB — CBC WITH DIFFERENTIAL/PLATELET
Abs Immature Granulocytes: 0.01 10*3/uL (ref 0.00–0.07)
Basophils Absolute: 0 10*3/uL (ref 0.0–0.1)
Basophils Relative: 0 %
Eosinophils Absolute: 0.1 10*3/uL (ref 0.0–0.5)
Eosinophils Relative: 3 %
HCT: 36.3 % (ref 36.0–46.0)
Hemoglobin: 12 g/dL (ref 12.0–15.0)
Immature Granulocytes: 0 %
Lymphocytes Relative: 23 %
Lymphs Abs: 1 10*3/uL (ref 0.7–4.0)
MCH: 26 pg (ref 26.0–34.0)
MCHC: 33.1 g/dL (ref 30.0–36.0)
MCV: 78.7 fL — ABNORMAL LOW (ref 80.0–100.0)
Monocytes Absolute: 0.8 10*3/uL (ref 0.1–1.0)
Monocytes Relative: 19 %
Neutro Abs: 2.3 10*3/uL (ref 1.7–7.7)
Neutrophils Relative %: 55 %
Platelets: 194 10*3/uL (ref 150–400)
RBC: 4.61 MIL/uL (ref 3.87–5.11)
RDW: 15.7 % — ABNORMAL HIGH (ref 11.5–15.5)
WBC: 4.3 10*3/uL (ref 4.0–10.5)
nRBC: 0 % (ref 0.0–0.2)

## 2023-07-19 MED ORDER — IOHEXOL 350 MG/ML SOLN
75.0000 mL | Freq: Once | INTRAVENOUS | Status: AC | PRN
  Administered 2023-07-19: 75 mL via INTRAVENOUS

## 2023-07-19 MED ORDER — PROCHLORPERAZINE EDISYLATE 10 MG/2ML IJ SOLN
10.0000 mg | Freq: Once | INTRAMUSCULAR | Status: AC
  Administered 2023-07-19: 10 mg via INTRAVENOUS
  Filled 2023-07-19: qty 2

## 2023-07-19 MED ORDER — SODIUM CHLORIDE 0.9 % IV BOLUS
1000.0000 mL | Freq: Once | INTRAVENOUS | Status: AC
  Administered 2023-07-19: 1000 mL via INTRAVENOUS

## 2023-07-19 MED ORDER — DIPHENHYDRAMINE HCL 50 MG/ML IJ SOLN
12.5000 mg | Freq: Once | INTRAMUSCULAR | Status: AC
  Administered 2023-07-19: 12.5 mg via INTRAVENOUS
  Filled 2023-07-19: qty 1

## 2023-07-19 MED ORDER — SODIUM CHLORIDE 0.9 % IV SOLN: INTRAVENOUS | Status: DC

## 2023-07-19 MED ORDER — HYDROMORPHONE HCL 1 MG/ML IJ SOLN
1.0000 mg | Freq: Once | INTRAMUSCULAR | Status: AC
  Administered 2023-07-19: 1 mg via INTRAVENOUS
  Filled 2023-07-19: qty 1

## 2023-07-19 MED ORDER — CYCLOBENZAPRINE HCL 5 MG PO TABS: 5.0000 mg | ORAL_TABLET | Freq: Three times a day (TID) | ORAL | 0 refills | Status: DC | PRN

## 2023-07-19 NOTE — ED Triage Notes (Signed)
C/O head pressure from the posterior head that feels like its pushing towards her eyes, started around 1:15pm Patient have their hand placed on hear stating it provides some relief.

## 2023-07-19 NOTE — Discharge Instructions (Signed)
For your pain you can try Flexeril which is a muscle relaxer.  Use caution with taking this medication as this can cause drowsiness.  Do not take this medication and operate machinery. You can use Tylenol and ibuprofen as needed Trial lidocaine patches 4% over-the-counter

## 2023-07-19 NOTE — ED Notes (Signed)
ED Provider at bedside. 

## 2023-07-19 NOTE — ED Provider Notes (Signed)
[place] [facility] AT [facility] Provider Note   CSN: [id number] Arrival date & time: 07/19/23  1539     History  Chief Complaint  Patient presents with   Headache    Sonya White is a 62 y.o. female.  62 y.o. female presenting with acute onset posterior headache starting around 115 this afternoon.  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  She has associated pressure behind her eyes but denies vision changes.  She reports that she has had a history of tension headaches in the past which normally get better with ibuprofen.  She does not see a doctor regularly and denies being on any chronic medications.  She denies having cardiac issues in the past.  She denies nausea and vomiting.  Pain is worse when she turns her head and does not seem to radiate down her back or shoulders.  She denies anticoagulant use, seizure or loss of consciousness.  Denies trauma.  The history is provided by the patient. No language interpreter was used.  Headache Associated symptoms: no abdominal pain, no congestion, no cough, no dizziness, no ear pain, no fatigue, no fever, no hearing loss, no nausea, no numbness, no seizures, no sinus pressure, no sore throat, no vomiting and no weakness        Home Medications Prior to Admission medications   Medication Sig Start Date End Date Taking? Authorizing Provider  Apple Cid Vn-Grn Tea-Bit Or-Cr (APPLE CIDER VINEGAR PLUS) TABS Take by mouth. Patient not taking: Reported on 07/12/2021    [provider]  chlorthalidone (HYGROTON) 25 MG tablet Take 1 tablet (25 mg total) by mouth daily. Patient not taking: Reported on 05/19/2020 04/13/20   Corinna Capra A, DO  ibuprofen (ADVIL) 200 MG tablet Take 200 mg by mouth 3 (three) times daily. prn Patient not taking: Reported on 04/30/2023    [provider]  Iron-Vitamins (GERITOL COMPLETE PO) Take by mouth. One  tblspn qd Patient not taking: Reported on 04/30/2023    [provider]  meclizine (ANTIVERT) 25 MG tablet Take 1 tablet (25 mg total) by mouth 3 (three) times daily as needed for dizziness. Patient not taking: Reported on 04/30/2023 07/12/21   Gloris Manchester, MD  ondansetron (ZOFRAN ODT) 4 MG disintegrating tablet Take 1 tablet (4 mg total) by mouth every 8 (eight) hours as needed for up to 12 doses for nausea or vomiting. Patient not taking: Reported on 04/30/2023 07/12/21   Gloris Manchester, MD  Vitamin D, Ergocalciferol, (DRISDOL) 1.25 MG (50000 UNIT) CAPS capsule Take 1 capsule (50,000 Units total) by mouth every 7 (seven) days. Patient not taking: Reported on 05/19/2020 04/13/20   Corinna Capra A, DO      Allergies    Oxycodone-acetaminophen    Review of Systems   Review of Systems  Constitutional:  Positive for activity change. Negative for chills, fatigue and fever.  HENT:  Negative for congestion, ear discharge, ear pain, facial swelling, hearing loss, nosebleeds, sinus pressure, sinus pain, sore throat, tinnitus and voice change.   Respiratory:  Negative for cough, chest tightness and shortness of breath.   Cardiovascular:  Negative for chest pain.  Gastrointestinal:  Negative for abdominal distention, abdominal pain, nausea and vomiting.  Neurological:  Positive for headaches. Negative for dizziness, seizures, syncope, facial asymmetry, speech difficulty, weakness, light-headedness and numbness.    Physical Exam Updated Vital Signs BP (!) 145/85   Pulse  76   Temp 98.4 F (36.9 C)   Resp 17   Ht 5\' 2"  (1.575 m)   Wt 104.8 kg   SpO2 94%   BMI 42.25 kg/m  Physical Exam Constitutional:      Appearance: She is well-developed. She is obese. She is ill-appearing.  HENT:     Head: Normocephalic.     Mouth/Throat:     Mouth: Mucous membranes are moist.  Eyes:     Extraocular Movements: Extraocular movements intact.     Right eye: Normal extraocular motion and no nystagmus.      Left eye: Normal extraocular motion and no nystagmus.     Pupils: Pupils are equal, round, and reactive to light.  Neck:     Meningeal: Kernig's sign absent.     Comments: ROM limited due to pain Cardiovascular:     Rate and Rhythm: Normal rate and regular rhythm.  Pulmonary:     Effort: Pulmonary effort is normal.     Breath sounds: Normal breath sounds.  Abdominal:     General: Bowel sounds are normal.     Palpations: Abdomen is soft.  Musculoskeletal:        General: Normal range of motion.  Lymphadenopathy:     Cervical: No cervical adenopathy.  Skin:    General: Skin is warm.     Capillary Refill: Capillary refill takes less than 2 seconds.  Neurological:     Mental Status: She is alert and oriented to person, place, and time. Mental status is at baseline.     GCS: GCS eye subscore is 4. GCS verbal subscore is 5. GCS motor subscore is 6.  Psychiatric:        Mood and Affect: Mood normal.        Speech: Speech normal.        Behavior: Behavior normal.     ED Results / Procedures / Treatments   Labs (all labs ordered are listed, but only abnormal results are displayed) Labs Reviewed  CBC WITH DIFFERENTIAL/PLATELET - Abnormal; Notable for the following components:      Result Value   MCV 78.7 (*)    RDW 15.7 (*)    All other components within normal limits  COMPREHENSIVE METABOLIC PANEL    EKG None  Radiology CT ANGIO HEAD NECK W WO CM  Result Date: 07/19/2023 CLINICAL DATA:  Sudden onset of occipital headache and upper posterior neck pain. EXAM: CT ANGIOGRAPHY HEAD AND NECK WITH AND WITHOUT CONTRAST TECHNIQUE: Multidetector CT imaging of the head and neck was performed using the standard protocol during bolus administration of intravenous contrast. Multiplanar CT image reconstructions and MIPs were obtained to evaluate the vascular anatomy. Carotid stenosis measurements (when applicable) are obtained utilizing NASCET criteria, using the distal internal carotid  diameter as the denominator. RADIATION DOSE REDUCTION: This exam was performed according to the departmental dose-optimization program which includes automated exposure control, adjustment of the mA and/or kV according to patient size and/or use of iterative reconstruction technique. CONTRAST:  75mL OMNIPAQUE IOHEXOL 350 MG/ML SOLN COMPARISON:  CT head without contrast 07/12/2021. FINDINGS: CT HEAD FINDINGS Brain: No acute infarct, hemorrhage, or mass lesion is present. The ventricles are of normal size. No significant white matter lesions are present. Deep brain nuclei are within normal limits. The brainstem and cerebellum are within normal limits. Midline structures are within normal limits. Vascular: No hyperdense vessel or unexpected calcification. Skull: Calvarium is intact. No focal lytic or blastic lesions are present. No significant extracranial  soft tissue lesion is present. Sinuses/Orbits: The paranasal sinuses and mastoid air cells are clear. The globes and orbits are within normal limits. Review of the MIP images confirms the above findings CTA NECK FINDINGS Aortic arch: A 3 vessel arch configuration is present. No significant atherosclerotic calcification is present. No stenosis or aneurysm is present. Right carotid system: The right common carotid artery is within normal limits. The bifurcation is unremarkable. The cervical right ICA is normal. Left carotid system: The left common carotid artery is within normal limits. Minimal calcifications are present posteriorly along the proximal left ICA without significant stenosis. The more distal left ICA is normal. Vertebral arteries: The right vertebral artery originates from the right subclavian artery without significant stenosis. The left vertebral artery is a normal variant arising directly from the left external carotid artery without focal stenosis. Vertebral arteries are codominant. Left vertebral artery enters at the craniocervical junction.  Skeleton: Asymmetric endplate degenerative changes are present in the lower cervical spine at C6-7 and C7-T1. Vertebral body heights are normal. Straightening of the normal cervical lordosis is present. No focal osseous lesions are present. Other neck: Soft tissues the neck are otherwise unremarkable. Salivary glands are within normal limits. Thyroid is normal. No significant adenopathy is present. No focal mucosal or submucosal lesions are present. Upper chest: The lung apices are clear. Review of the MIP images confirms the above findings CTA HEAD FINDINGS Anterior circulation: Internal carotid arteries are within normal limits from the skull base to the ICA termini. The A1 and M1 segments are normal. The anterior communicating artery is patent. The MCA bifurcations are normal. The ACA and MCA branch vessels are within normal limits bilaterally. Posterior circulation: The vertebral arteries are codominant. The PICA origins are visualized and normal. The vertebrobasilar junction and basilar artery normal. The superior cerebellar arteries patent. Posterior cerebral arteries originate from the basilar tip. The PCA branch vessels are normal bilaterally. Venous sinuses: The dural sinuses are patent. The straight sinus and deep cerebral veins are intact. Cortical veins are within normal limits. No significant vascular malformation is evident. Anatomic variants: The left vertebral artery originates from the proximal left external carotid artery. Review of the MIP images confirms the above findings IMPRESSION: 1. No acute or focal vascular pathology to explain the patient's neck pain or headaches. 2. Normal noncontrast CT appearance of the head. 3. Minimal calcifications along the proximal left ICA without significant stenosis. 4. Normal variant CTA Circle of Willis without significant proximal stenosis, aneurysm, or branch vessel occlusion. 5. Variant origin of the left vertebral artery from the proximal left external  carotid artery without focal stenosis or aneurysm. 6. Asymmetric endplate degenerative changes in the lower cervical spine at C6-7 and C7-T1. This could be a source of pain. Electronically Signed   By: Marin Roberts M.D.   On: 07/19/2023 18:03    Procedures Procedures    Medications Ordered in ED Medications  sodium chloride 0.9 % bolus 1,000 mL (1,000 mLs Intravenous New Bag/Given 07/19/23 1658)    And  0.9 %  sodium chloride infusion (has no administration in time range)  diphenhydrAMINE (BENADRYL) injection 12.5 mg (12.5 mg Intravenous Given 07/19/23 1658)  prochlorperazine (COMPAZINE) injection 10 mg (10 mg Intravenous Given 07/19/23 1659)  HYDROmorphone (DILAUDID) injection 1 mg (1 mg Intravenous Given 07/19/23 1658)  iohexol (OMNIPAQUE) 350 MG/ML injection 75 mL (75 mLs Intravenous Contrast Given 07/19/23 1730)    ED Course/ Medical Decision Making/ A&P  Medical Decision Making 62 y.o. female presenting with acute onset posterior headache. On arrival VSS with GCS of 15. On exam patient in obvious discomfort and unable to move neck due to severe pain. No focal findings on neurologic exam. Differential includes SAH, subdural hematoma, acute herpes zoster, brain malignancy, GCA, meningitis, hypertension.  Due to her clinical presentation and history of headache will order CT head angio and neck and give migraine cocktail and IVF.  Obtained CMP and CBC which resulted within normal limits.   CTA head and neck resulting with normal anatomical variants without signs of acute bleed.  On reassessment, patient reports pain has somewhat improved with migraine cocktail however is still present when she turns her head.  Discussed that it is possible the degenerative changes in her cervical spine may be responsible for her pain however this would be an unusual presentation.  Discussed pursuing LP for completeness and to further rule out infection or bleed.  On  discussing risk and benefits patient declined at this time.  Discussed trialing conservative therapies with NSAIDs, Tylenol, heating pad, lidocaine patches, and will prescribe short course of muscle relaxer.  Patient is in agreement with plan and is going to establish care with a PCP outpatient for follow-up.  Return precautions discussed with patient.  Amount and/or Complexity of Data Reviewed Labs: ordered. Radiology: ordered.  Risk Prescription drug management.          Final Clinical Impression(s) / ED Diagnoses Final diagnoses:  None    Rx / DC Orders ED Discharge Orders     None         Glendale Chard, DO 07/19/23 1909    Tegeler, Canary Brim, MD 07/20/23 [phone]

## 2023-07-19 NOTE — ED Notes (Signed)
Pt provided graham crackers per her request. Call bell within reach, will continue to monitor.

## 2023-07-19 NOTE — ED Notes (Signed)
D/c paperwork reviewed with pt, including prescriptions and follow up care.  All questions and/or concerns addressed at time of d/c.  No further needs expressed. . Pt verbalized understanding, Ambulatory with family to ED exit, NAD.   

## 2023-12-02 ENCOUNTER — Telehealth: Payer: Self-pay

## 2023-12-02 NOTE — Telephone Encounter (Signed)
 I had advised patient that PCP is no longer accepting new patient appointments

## 2023-12-02 NOTE — Telephone Encounter (Signed)
 Is this [place] with you  ?

## 2023-12-02 NOTE — Telephone Encounter (Signed)
 Pt is the mother of Aline August (Est pt) and is requesting to set up a NP appointment with Dr. Okey Dupre.   Please confirm so pt may be scheduled.

## 2023-12-03 NOTE — Telephone Encounter (Signed)
 Fine

## 2024-01-07 ENCOUNTER — Encounter: Payer: Self-pay | Admitting: Internal Medicine

## 2024-01-07 ENCOUNTER — Ambulatory Visit: Payer: Self-pay | Admitting: Internal Medicine

## 2024-01-07 ENCOUNTER — Other Ambulatory Visit: Payer: Self-pay | Admitting: Internal Medicine

## 2024-01-07 VITALS — BP 128/82 | HR 82 | Temp 98.5°F | Ht 62.0 in | Wt 241.0 lb

## 2024-01-07 DIAGNOSIS — Z0000 Encounter for general adult medical examination without abnormal findings: Secondary | ICD-10-CM | POA: Diagnosis not present

## 2024-01-07 DIAGNOSIS — M25561 Pain in right knee: Secondary | ICD-10-CM

## 2024-01-07 DIAGNOSIS — M25562 Pain in left knee: Secondary | ICD-10-CM | POA: Diagnosis not present

## 2024-01-07 DIAGNOSIS — E559 Vitamin D deficiency, unspecified: Secondary | ICD-10-CM

## 2024-01-07 DIAGNOSIS — M542 Cervicalgia: Secondary | ICD-10-CM | POA: Diagnosis not present

## 2024-01-07 DIAGNOSIS — R7303 Prediabetes: Secondary | ICD-10-CM | POA: Diagnosis not present

## 2024-01-07 DIAGNOSIS — Z1211 Encounter for screening for malignant neoplasm of colon: Secondary | ICD-10-CM

## 2024-01-07 DIAGNOSIS — Z0001 Encounter for general adult medical examination with abnormal findings: Secondary | ICD-10-CM | POA: Insufficient documentation

## 2024-01-07 DIAGNOSIS — G8929 Other chronic pain: Secondary | ICD-10-CM | POA: Insufficient documentation

## 2024-01-07 LAB — LIPID PANEL
Cholesterol: 168 mg/dL (ref 0–200)
HDL: 42.4 mg/dL (ref >39.00)
LDL Cholesterol: 112 mg/dL — ABNORMAL HIGH (ref 0–99)
NonHDL: 125.49
Total CHOL/HDL Ratio: 4
Triglycerides: 66 mg/dL (ref 0.0–149.0)
VLDL: 13.2 mg/dL (ref 0.0–40.0)

## 2024-01-07 LAB — COMPREHENSIVE METABOLIC PANEL
ALT: 11 U/L (ref 0–35)
AST: 15 U/L (ref 0–37)
Albumin: 4 g/dL (ref 3.5–5.2)
Alkaline Phosphatase: 46 U/L (ref 39–117)
BUN: 9 mg/dL (ref 6–23)
CO2: 29 meq/L (ref 19–32)
Calcium: 9.2 mg/dL (ref 8.4–10.5)
Chloride: 105 meq/L (ref 96–112)
Creatinine, Ser: 0.61 mg/dL (ref 0.40–1.20)
GFR: 96.83 mL/min (ref >60.00)
Glucose, Bld: 104 mg/dL — ABNORMAL HIGH (ref 70–99)
Potassium: 4 meq/L (ref 3.5–5.1)
Sodium: 139 meq/L (ref 135–145)
Total Bilirubin: 0.5 mg/dL (ref 0.2–1.2)
Total Protein: 7.8 g/dL (ref 6.0–8.3)

## 2024-01-07 LAB — CBC
HCT: 37.6 % (ref 36.0–46.0)
Hemoglobin: 12.2 g/dL (ref 12.0–15.0)
MCHC: 32.4 g/dL (ref 30.0–36.0)
MCV: 83.7 fl (ref 78.0–100.0)
Platelets: 191 10*3/uL (ref 150.0–400.0)
RBC: 4.49 Mil/uL (ref 3.87–5.11)
RDW: 15.8 % — ABNORMAL HIGH (ref 11.5–15.5)
WBC: 4.1 10*3/uL (ref 4.0–10.5)

## 2024-01-07 LAB — HEMOGLOBIN A1C: Hgb A1c MFr Bld: 5.6 % (ref 4.6–6.5)

## 2024-01-07 LAB — VITAMIN D 25 HYDROXY (VIT D DEFICIENCY, FRACTURES): VITD: 10.66 ng/mL — ABNORMAL LOW (ref 30.00–100.00)

## 2024-01-07 MED ORDER — VITAMIN D (ERGOCALCIFEROL) 1.25 MG (50000 UNIT) PO CAPS: 50000.0000 [IU] | ORAL_CAPSULE | ORAL | 0 refills | Status: DC

## 2024-01-07 NOTE — Assessment & Plan Note (Signed)
 No labs in some time. Checking HGA1c and adjust as needed.

## 2024-01-07 NOTE — Progress Notes (Signed)
   Subjective:   Patient ID: Sonya White, female    DOB: 03/18/1961, 63 y.o.   MRN: [id number]  HPI The patient is a new 63 YO female coming in for ongoing care (chronic knee pain and needs replacement at some time, otherwise healthy) and desires physical as well.   PMH, [facility], social history reviewed and updated  Review of Systems  Constitutional: Negative.   HENT: Negative.    Eyes: Negative.   Respiratory:  Negative for cough, chest tightness and shortness of breath.   Cardiovascular:  Negative for chest pain, palpitations and leg swelling.  Gastrointestinal:  Negative for abdominal distention, abdominal pain, constipation, diarrhea, nausea and vomiting.  Musculoskeletal:  Positive for arthralgias and myalgias.  Skin: Negative.   Neurological: Negative.   Psychiatric/Behavioral: Negative.      Objective:  Physical Exam Constitutional:      Appearance: She is well-developed. She is obese.  HENT:     Head: Normocephalic and atraumatic.  Cardiovascular:     Rate and Rhythm: Normal rate and regular rhythm.  Pulmonary:     Effort: Pulmonary effort is normal. No respiratory distress.     Breath sounds: Normal breath sounds. No wheezing or rales.  Abdominal:     General: Bowel sounds are normal. There is no distension.     Palpations: Abdomen is soft.     Tenderness: There is no abdominal tenderness. There is no rebound.  Musculoskeletal:        General: Tenderness present.     Cervical back: Normal range of motion.  Skin:    General: Skin is warm and dry.  Neurological:     Mental Status: She is alert and oriented to person, place, and time.     Coordination: Coordination normal.     Vitals:   01/07/24 1017  TempSrc: Oral  Weight: 241 lb (109.3 kg)  Height: 5\' 2"  (1.575 m)    Assessment & Plan:

## 2024-01-07 NOTE — Assessment & Plan Note (Signed)
 No follow up labs in some time. Checking vitamin D and adjust as needed.

## 2024-01-07 NOTE — Assessment & Plan Note (Signed)
 Flu shot counseled yearly. Shingrix counseled declines today. Tetanus counseled declines today. Colonoscopy due referral done. Mammogram counseled due, pap smear counseled due. Counseled about sun safety and mole surveillance. Counseled about the dangers of distracted driving. Given 10 year screening recommendations.

## 2024-01-07 NOTE — Assessment & Plan Note (Signed)
 Some instability and pain daily. She is thinking about knee replacement. At this time her BMI is likely too high for replacement surgery. She is using ibuprofen otc for pain. Checking CBC and CMP for monitoring.

## 2024-01-07 NOTE — Patient Instructions (Signed)
 We will check the labs today and think about the shingles vaccine.

## 2024-01-07 NOTE — Assessment & Plan Note (Signed)
 Ongoing and ER visit 07/2023 for same with CT angio neck and head without acute findings. 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 Given exercises today to help and offered PT which she will do if home exercises do not help.

## 2024-01-07 NOTE — Assessment & Plan Note (Signed)
 Checking lipid panel and Hga1c and adjust as needed.

## 2024-01-21 ENCOUNTER — Encounter: Payer: Self-pay | Admitting: Pediatrics

## 2024-02-14 ENCOUNTER — Ambulatory Visit (AMBULATORY_SURGERY_CENTER)

## 2024-02-14 VITALS — Ht 62.0 in | Wt 233.0 lb

## 2024-02-14 DIAGNOSIS — Z1211 Encounter for screening for malignant neoplasm of colon: Secondary | ICD-10-CM

## 2024-02-14 MED ORDER — NA SULFATE-K SULFATE-MG SULF 17.5-3.13-1.6 GM/177ML PO SOLN
1.0000 | Freq: Once | ORAL | 0 refills | Status: AC
Start: 2024-02-14 — End: 2024-02-14

## 2024-02-14 NOTE — Progress Notes (Signed)

## 2024-02-20 ENCOUNTER — Encounter: Payer: Self-pay | Admitting: Pediatrics

## 2024-03-03 NOTE — Progress Notes (Signed)
 [facility] History and Physical   Primary Care Physician:  Adelia Homestead, MD   Reason for Procedure:  Colorectal cancer surveillance, history of ulcerative colitis diagnosed at age 63, last colonoscopy performed 2014  Plan:    Surveillance colonoscopy     HPI: Sonya White is a 63 y.o. female undergoing surveillance colonoscopy for colorectal cancer surveillance in the setting of ulcerative colitis.  Patient's chart documents a history of ulcerative colitis diagnosed in 2017.  Patient is not currently on any IBD medical therapy.  Last colonoscopy was performed in 2014 showing inflammation throughout the colon as well as inflammation in the terminal ileum.  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  No documented family history of colorectal cancer or polyps.   Past Medical History:  Diagnosis Date   Colitis    Hip joint pain    History of stomach ulcers    Influenza vaccine refused 9/14   Nocturnal enuresis    history of, resolved   Obesity    Urge incontinence    Uterine fibroid    s/p hysterectomy   Wears glasses     Past Surgical History:  Procedure Laterality Date   ABDOMINAL HYSTERECTOMY     due to fibroids; partial; has both ovaries   CESAREAN SECTION     COLONOSCOPY     pending 08/10/13, Dr. Tova Fresh   HIP SURGERY     KNEE ARTHROSCOPY     WISDOM TOOTH EXTRACTION      Prior to Admission medications   Medication Sig Start Date End Date Taking? Authorizing Provider  cyclobenzaprine  (FLEXERIL ) 5 MG tablet Take 1 tablet (5 mg total) by mouth 3 (three) times daily as needed for muscle spasms. 07/19/23   Clem Currier, DO  Vitamin D , Ergocalciferol , (DRISDOL ) 1.25 MG (50000 UNIT) CAPS capsule Take 1 capsule (50,000 Units total) by mouth every 7 (seven) days. 01/07/24   Adelia Homestead, MD    Current Outpatient Medications  Medication Sig Dispense Refill   cyclobenzaprine  (FLEXERIL ) 5 MG  tablet Take 1 tablet (5 mg total) by mouth 3 (three) times daily as needed for muscle spasms. 10 tablet 0   Vitamin D , Ergocalciferol , (DRISDOL ) 1.25 MG (50000 UNIT) CAPS capsule Take 1 capsule (50,000 Units total) by mouth every 7 (seven) days. 12 capsule 0   Current Facility-Administered Medications  Medication Dose Route Frequency Provider Last Rate Last Admin   0.9 %  sodium chloride  infusion  500 mL Intravenous Once Telicia Hodgkiss, Scarlette Currier, MD        Allergies as of 03/05/2024 - Review Complete 03/05/2024  Allergen Reaction Noted   Oxycodone -acetaminophen  Itching 05/21/2014    Family History  Problem Relation Age of Onset   Cancer Mother 79       multiple myeloma 63yo; breast 63yo   AAA (abdominal aortic aneurysm) Mother    Glaucoma Father    Sudden death Father    Glaucoma Sister    Heart disease Brother        thinks it was related to steroid abuse   Diabetes Neg Hx    Stroke Neg Hx    Hypertension Neg Hx    Colon cancer Neg Hx    Rectal cancer Neg Hx    Stomach cancer Neg Hx     Social History   Socioeconomic History   Marital status: Divorced    Spouse name: Not on file   Number of children: Not on file  Years of education: Not on file   Highest education level: Not on file  Occupational History   Occupation: [employer] Engineer, drilling  Tobacco Use   Smoking status: Never   Smokeless tobacco: Never  Vaping Use   Vaping status: Never Used  Substance and Sexual Activity   Alcohol use: Yes    Alcohol/week: 20.0 standard drinks of alcohol    Types: 20 Shots of liquor per week    Comment: occasional   Drug use: No   Sexual activity: Not Currently  Other Topics Concern   Not on file  Social History Narrative   Driver for [employer], 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, [place] , walking for exercise;  Has 24yo and 28yo children   Social Drivers of Corporate investment banker Strain: Not on file  Food Insecurity: No Food Insecurity (04/30/2023)   Hunger Vital Sign    Worried  About Running Out of Food in the Last Year: Never true    Ran Out of Food in the Last Year: Never true  Transportation Needs: No Transportation Needs (04/30/2023)   PRAPARE - Administrator, Civil Service (Medical): No    Lack of Transportation (Non-Medical): No  Physical Activity: Not on file  Stress: Not on file  Social Connections: Unknown (02/23/2022)   [facility]   Social Network    Social Network: Not on file  Intimate Partner Violence: Unknown (01/15/2022)   Received from [facility]   HITS    Physically Hurt: Not on file    Insult or Talk Down To: Not on file    Threaten Physical Harm: Not on file    Scream or Curse: Not on file    Review of Systems:  All other review of systems negative except as mentioned in the HPI.  Physical Exam: Vital signs BP (!) 156/99   Pulse 67   Temp (!) 97.3 F (36.3 C) (Skin)   Ht 5\' 2"  (1.575 m)   Wt 233 lb (105.7 kg)   SpO2 100%   BMI 42.62 kg/m   General:   Alert,  Well-developed, well-nourished, pleasant and cooperative in NAD Airway:  Mallampati 2 Lungs:  Clear throughout to auscultation.   Heart:  Regular rate and rhythm; no murmurs, clicks, rubs,  or gallops. Abdomen:  Soft, nontender and nondistended. Normal bowel sounds.   Neuro/Psych:  Normal mood and affect. A and O x 3  Eugenia Hess, MD [facility]

## 2024-03-05 ENCOUNTER — Ambulatory Visit (AMBULATORY_SURGERY_CENTER): Admitting: Pediatrics

## 2024-03-05 ENCOUNTER — Encounter: Payer: Self-pay | Admitting: Pediatrics

## 2024-03-05 VITALS — BP 138/82 | HR 77 | Temp 97.3°F | Resp 18 | Ht 62.0 in | Wt 233.0 lb

## 2024-03-05 DIAGNOSIS — K648 Other hemorrhoids: Secondary | ICD-10-CM

## 2024-03-05 DIAGNOSIS — Z1211 Encounter for screening for malignant neoplasm of colon: Secondary | ICD-10-CM | POA: Diagnosis not present

## 2024-03-05 DIAGNOSIS — K5000 Crohn's disease of small intestine without complications: Secondary | ICD-10-CM | POA: Diagnosis not present

## 2024-03-05 DIAGNOSIS — K644 Residual hemorrhoidal skin tags: Secondary | ICD-10-CM | POA: Diagnosis not present

## 2024-03-05 DIAGNOSIS — K529 Noninfective gastroenteritis and colitis, unspecified: Secondary | ICD-10-CM | POA: Diagnosis not present

## 2024-03-05 DIAGNOSIS — K6289 Other specified diseases of anus and rectum: Secondary | ICD-10-CM

## 2024-03-05 MED ORDER — SODIUM CHLORIDE 0.9 % IV SOLN: 500.0000 mL | Freq: Once | INTRAVENOUS | Status: DC

## 2024-03-05 NOTE — Progress Notes (Signed)
 Pt's states no medical or surgical changes since previsit or office visit.

## 2024-03-05 NOTE — Progress Notes (Signed)
 Called to room to assist during endoscopic procedure.  Patient ID and intended procedure confirmed with present staff. Received instructions for my participation in the procedure from the performing physician.

## 2024-03-05 NOTE — Op Note (Signed)
 [facility] Patient Name: Sonya White Procedure Date: 03/05/2024 11:10 AM MRN: [id number] Endoscopist: Eugenia Hess , MD, [id number] Age: 63 Referring MD:  Date of Birth: 1961/02/22 Gender: Female Account #: [id number] Procedure:                Colonoscopy Indications:              High risk colon cancer surveillance: Inflammatory                            bowel disease, Last colonoscopy: 2014; history of                            inflammatory bowel disease diagnosed as ulcerative                            colitis at age 15 -last colonoscopy in 2014 raise                            possibility of Crohn's disease due to inflammatory                            changes in the terminal ileum. Patient is not on                            any medical therapy for IBD. Medicines:                Monitored Anesthesia Care Procedure:                Pre-Anesthesia Assessment:                           - Prior to the procedure, a History and Physical                            was performed, and patient medications and                            allergies were reviewed. The patient's tolerance of                            previous anesthesia was also reviewed. The risks                            and benefits of the procedure and the sedation                            options and risks were discussed with the patient.                            All questions were answered, and informed consent                            was obtained. Prior Anticoagulants: The patient has  taken no anticoagulant or antiplatelet agents. ASA                            Grade Assessment: II - A patient with mild systemic                            disease. After reviewing the risks and benefits,                            the patient was deemed in satisfactory condition to                            undergo the procedure.                           After obtaining informed  consent, the colonoscope                            was passed under direct vision. Throughout the                            procedure, the patient's blood pressure, pulse, and                            oxygen  saturations were monitored continuously. The                            Olympus Scope [id number] was introduced through the                            anus and advanced to the terminal ileum. The                            colonoscopy was performed without difficulty. The                            patient tolerated the procedure well. The quality                            of the bowel preparation was adequate. The terminal                            ileum, ileocecal valve, appendiceal orifice, and                            rectum were photographed. Scope In: 11:18:25 AM Scope Out: 11:51:06 AM Scope Withdrawal Time: 0 hours 22 minutes 27 seconds  Total Procedure Duration: 0 hours 32 minutes 41 seconds  Findings:                 Hemorrhoids were found on perianal exam.                           The digital rectal exam was normal. Pertinent  negatives include normal sphincter tone and no                            palpable rectal lesions.                           Scattered mild inflammation characterized by                            congestion (edema), erosions, erythema and loss of                            vascularity was found in the entire colon. The                            inflammatory changes more prominent in the cecum,                            ascending colon and proximal transverse colon.                            Changes were more mild and less evident in the                            rectosigmoid colon. Four biopsies were taken every                            10 cm with a cold forceps from the cecum, ascending                            colon, transverse colon, descending colon, sigmoid                            colon and rectum.  These biopsy specimens were sent                            to Pathology for evaluation of inflammation and                            dysplasia surveillance.                           The terminal ileum appeared overall normal. Two                            small erosions were noted in the terminal ileum                            just beyond the os of the ileocecal valve. The                            terminal ileum was intubated approximately 10 cm  and the remainder of the terminal ileum appeared                            normal. Biopsies were taken with a cold forceps for                            histology.                           Internal hemorrhoids were found during retroflexion. Complications:            No immediate complications. Estimated blood loss:                            Minimal. Estimated Blood Loss:     Estimated blood loss was minimal. Impression:               - Hemorrhoids found on perianal exam.                           - Scattered mild inflammation was found in the                            entire examined colon. Biopsied.                           - The examined portion of the ileum was normal with                            the exception of 2 small erosions in the terminal                            ileum just beyond the os of the ileocecal valve..                            Biopsied.                           - Internal hemorrhoids.                           - Findings today are indicative of active                            inflammatory bowel disease?"diagnosis remains                            indeterminate between Crohn's disease and                            ulcerative colitis. Inflammatory changes are                            overall mild. Recommendation:           - Discharge patient to home (ambulatory).                           -  Await pathology results.                           - Schedule patient for MR  enterography for                            differentiation between Crohn's disease and                            ulcerative colitis, will also needed updated labs                            for IBD                           - Repeat colonoscopy for surveillance based on                            pathology results.                           - Pending results of colonoscopy pathology, MR                            enterography results and labs decisions can be made                            regarding medical therapy for management of IBD                           - The findings and recommendations were discussed                            with the patient's family.                           - Return to GI clinic in 1 month.                           - Patient has a contact number available for                            emergencies. The signs and symptoms of potential                            delayed complications were discussed with the                            patient. Return to normal activities tomorrow.                            Written discharge instructions were provided to the                            patient. Eugenia Hess, MD 03/05/2024 11:59:49 AM This report has been  signed electronically.

## 2024-03-05 NOTE — Progress Notes (Signed)
 Vss nad trans to pacu

## 2024-03-05 NOTE — Patient Instructions (Signed)
 Educational handout provided to patient related to Hemorrhoids  Resume previous diet  Continue present medications  Awaiting pathology results  YOU HAD AN ENDOSCOPIC PROCEDURE TODAY AT THE [facility]:   Refer to the procedure report that was given to you for any specific questions about what was found during the examination.  If the procedure report does not answer your questions, please call your gastroenterologist to clarify.  If you requested that your care partner not be given the details of your procedure findings, then the procedure report has been included in a sealed envelope for you to review at your convenience later.  YOU SHOULD EXPECT: Some feelings of bloating in the abdomen. Passage of more gas than usual.  Walking can help get rid of the air that was put into your GI tract during the procedure and reduce the bloating. If you had a lower endoscopy (such as a colonoscopy or flexible sigmoidoscopy) you may notice spotting of blood in your stool or on the toilet paper. If you underwent a bowel prep for your procedure, you may not have a normal bowel movement for a few days.  Please Note:  You might notice some irritation and congestion in your nose or some drainage.  This is from the oxygen used during your procedure.  There is no need for concern and it should clear up in a day or so.  SYMPTOMS TO REPORT IMMEDIATELY:  Following lower endoscopy (colonoscopy or flexible sigmoidoscopy):  Excessive amounts of blood in the stool  Significant tenderness or worsening of abdominal pains  Swelling of the abdomen that is new, acute  Fever of 100F or higher  For urgent or emergent issues, a gastroenterologist can be reached at any hour by calling [phone]. Do not use MyChart messaging for urgent concerns.    DIET:  We do recommend a small meal at first, but then you may proceed to your regular diet.  Drink plenty of fluids but you should avoid alcoholic beverages for  24 hours.  ACTIVITY:  You should plan to take it easy for the rest of today and you should NOT DRIVE or use heavy machinery until tomorrow (because of the sedation medicines used during the test).    FOLLOW UP: Our staff will call the number listed on your records the next business day following your procedure.  We will call around 7:15- 8:00 am to check on you and address any questions or concerns that you may have regarding the information given to you following your procedure. If we do not reach you, we will leave a message.     If any biopsies were taken you will be contacted by phone or by letter within the next 1-3 weeks.  Please call us at [phone] if you have not heard about the biopsies in 3 weeks.    SIGNATURES/CONFIDENTIALITY: You and/or your care partner have signed paperwork which will be entered into your electronic medical record.  These signatures attest to the fact that that the information above on your After Visit Summary has been reviewed and is understood.  Full responsibility of the confidentiality of this discharge information lies with you and/or your care-partner.

## 2024-03-06 ENCOUNTER — Telehealth: Payer: Self-pay

## 2024-03-06 NOTE — Telephone Encounter (Signed)
  Follow up Call-     03/05/2024   10:52 AM  Call back number  Post procedure Call Back phone  # [phone]  Permission to leave phone message Yes     Patient questions:  Do you have a fever, pain , or abdominal swelling? No. Pain Score  0 *  Have you tolerated food without any problems? Yes.    Have you been able to return to your normal activities? Yes.    Do you have any questions about your discharge instructions: Diet   No. Medications  No. Follow up visit  No.  Do you have questions or concerns about your Care? No.  Actions: * If pain score is 4 or above: No action needed, pain <4.

## 2024-03-11 ENCOUNTER — Ambulatory Visit: Payer: Self-pay | Admitting: Pediatrics

## 2024-03-11 DIAGNOSIS — K529 Noninfective gastroenteritis and colitis, unspecified: Secondary | ICD-10-CM

## 2024-03-11 LAB — SURGICAL PATHOLOGY

## 2024-03-12 ENCOUNTER — Other Ambulatory Visit: Payer: Self-pay

## 2024-03-13 ENCOUNTER — Other Ambulatory Visit: Payer: Self-pay

## 2024-03-17 NOTE — Addendum Note (Signed)
 Addended by: Bryla Burek N on: 03/17/2024 11:23 AM   Modules accepted: Orders

## 2024-03-23 ENCOUNTER — Ambulatory Visit (HOSPITAL_COMMUNITY)
Admission: RE | Admit: 2024-03-23 | Discharge: 2024-03-23 | Disposition: A | Discharge status: Home / Self Care | Source: Ambulatory Visit | Attending: Pediatrics | Admitting: Pediatrics

## 2024-03-23 DIAGNOSIS — K529 Noninfective gastroenteritis and colitis, unspecified: Secondary | ICD-10-CM

## 2024-03-23 MED ORDER — GADOBUTROL 1 MMOL/ML IV SOLN
10.0000 mL | Freq: Once | INTRAVENOUS | Status: AC | PRN
  Administered 2024-03-23: 10 mL via INTRAVENOUS

## 2024-03-27 ENCOUNTER — Ambulatory Visit: Payer: Self-pay | Admitting: Pediatrics

## 2024-04-08 ENCOUNTER — Ambulatory Visit: Admitting: Nurse Practitioner

## 2024-04-08 ENCOUNTER — Encounter: Payer: Self-pay | Admitting: Nurse Practitioner

## 2024-04-08 ENCOUNTER — Other Ambulatory Visit (INDEPENDENT_AMBULATORY_CARE_PROVIDER_SITE_OTHER)

## 2024-04-08 VITALS — BP 130/62 | HR 85 | Ht 62.0 in | Wt 239.0 lb

## 2024-04-08 DIAGNOSIS — K523 Indeterminate colitis: Secondary | ICD-10-CM

## 2024-04-08 DIAGNOSIS — E279 Disorder of adrenal gland, unspecified: Secondary | ICD-10-CM

## 2024-04-08 LAB — COMPREHENSIVE METABOLIC PANEL WITH GFR
ALT: 11 U/L (ref 0–35)
AST: 16 U/L (ref 0–37)
Albumin: 4 g/dL (ref 3.5–5.2)
Alkaline Phosphatase: 54 U/L (ref 39–117)
BUN: 8 mg/dL (ref 6–23)
CO2: 29 meq/L (ref 19–32)
Calcium: 9.1 mg/dL (ref 8.4–10.5)
Chloride: 103 meq/L (ref 96–112)
Creatinine, Ser: 0.69 mg/dL (ref 0.40–1.20)
GFR: 93.83 mL/min (ref >60.00)
Glucose, Bld: 93 mg/dL (ref 70–99)
Potassium: 4 meq/L (ref 3.5–5.1)
Sodium: 138 meq/L (ref 135–145)
Total Bilirubin: 0.3 mg/dL (ref 0.2–1.2)
Total Protein: 7.7 g/dL (ref 6.0–8.3)

## 2024-04-08 LAB — CBC WITH DIFFERENTIAL/PLATELET
Basophils Absolute: 0 10*3/uL (ref 0.0–0.1)
Basophils Relative: 0.5 % (ref 0.0–3.0)
Eosinophils Absolute: 0.3 10*3/uL (ref 0.0–0.7)
Eosinophils Relative: 5.4 % — ABNORMAL HIGH (ref 0.0–5.0)
HCT: 35.2 % — ABNORMAL LOW (ref 36.0–46.0)
Hemoglobin: 11.6 g/dL — ABNORMAL LOW (ref 12.0–15.0)
Lymphocytes Relative: 24.8 % (ref 12.0–46.0)
Lymphs Abs: 1.2 10*3/uL (ref 0.7–4.0)
MCHC: 33.1 g/dL (ref 30.0–36.0)
MCV: 80.4 fl (ref 78.0–100.0)
Monocytes Absolute: 0.9 10*3/uL (ref 0.1–1.0)
Monocytes Relative: 19.4 % — ABNORMAL HIGH (ref 3.0–12.0)
Neutro Abs: 2.4 10*3/uL (ref 1.4–7.7)
Neutrophils Relative %: 49.9 % (ref 43.0–77.0)
Platelets: 185 10*3/uL (ref 150.0–400.0)
RBC: 4.38 Mil/uL (ref 3.87–5.11)
RDW: 15.1 % (ref 11.5–15.5)
WBC: 4.8 10*3/uL (ref 4.0–10.5)

## 2024-04-08 LAB — C-REACTIVE PROTEIN: CRP: <1 mg/dL (ref 0.5–20.0)

## 2024-04-08 LAB — SEDIMENTATION RATE: Sed Rate: 53 mm/h — ABNORMAL HIGH (ref 0–30)

## 2024-04-08 LAB — VITAMIN D 25 HYDROXY (VIT D DEFICIENCY, FRACTURES): VITD: 20.47 ng/mL — ABNORMAL LOW (ref 30.00–100.00)

## 2024-04-08 NOTE — Progress Notes (Addendum)
 04/08/2024 Sonya White [id number] 11-07-1960   Chief Complaint: Colitis follow up  History of Present Illness: Sonya White is a 63 year old female with a past medical history of obesity and ulcerative colitis initially diagnosed in 5 at the age of 83 treated with Azulfidine for approximately 3 years in the 1980s, subsequent colonoscopies indicated potential Crohn's disease due to a finding of ileal inflammation with questionable backwash ileitis.   She underwent a colonoscopy by Dr. Inocente Hausen 03/05/2024 which showed scattered mild inflammation throughout the colon and 2 small erosions in the TI.  Biopsies throughout the colon showed chronic colitis. The TI biopsies mentioned non-specific  chronic inflammation and architectural distortion but no active inflammation. MR enterography 03/23/2024 was normal without any evidence of active inflammation in the small bowel. Her phenotype is currently indeterminate between Crohn's disease versus ulcerative colitis.She presents today for further colitis follow up. She denies having any N/V. No upper or lower abdominal pain. She passes a normal formed brown stool once daily. No bloody or black stools. No mucous per the rectum. She has a lot of gas per the rectum which she attributes to eating a lot of fruit and drinking fruit smoothies. She is not bother by increased flatulence, is not malodorous. She takes Tumeric and ginger supplements.  As noted above, she was on Azulfidine for 3 years in the 1980s for ulcerative colitis and has remained off colitis treatment since then. No NSAIDs. Nonsmoker. She drinks 1 or 2 glasses of wine monthly. No drug use. No known family history of IBD.       Latest Ref Rng & Units 01/07/2024   10:50 AM 07/19/2023    4:10 PM 07/12/2021    8:01 AM  CBC  WBC 4.0 - 10.5 K/uL 4.1  4.3  4.7   Hemoglobin 12.0 - 15.0 g/dL 87.7  87.9  87.3   Hematocrit 36.0 - 46.0 % 37.6  36.3  37.4   Platelets 150.0 - 400.0 K/uL 191.0   194  168        Latest Ref Rng & Units 01/07/2024   10:50 AM 07/19/2023    4:10 PM 07/12/2021    8:01 AM  CMP  Glucose 70 - 99 mg/dL 895  98  881   BUN 6 - 23 mg/dL 9  10  8    Creatinine 0.40 - 1.20 mg/dL 9.38  9.26  9.30   Sodium 135 - 145 mEq/L 139  138  133   Potassium 3.5 - 5.1 mEq/L 4.0  3.5  3.3   Chloride 96 - 112 mEq/L 105  102  99   CO2 19 - 32 mEq/L 29  26  27    Calcium 8.4 - 10.5 mg/dL 9.2  8.9  8.7   Total Protein 6.0 - 8.3 g/dL 7.8  8.0  8.2   Total Bilirubin 0.2 - 1.2 mg/dL 0.5  0.6  0.5   Alkaline Phos 39 - 117 U/L 46  49  45   AST 0 - 37 U/L 15  19  28    ALT 0 - 35 U/L 11  15  16      MR ABDOMEN AND PELVIS 03/23/2024:   Lower chest: No acute abnormality.  Cardiomegaly   Hepatobiliary: No solid liver abnormality is seen. Gallstone. No gallbladder wall thickening, or biliary dilatation.   Pancreas: Unremarkable. No pancreatic ductal dilatation or surrounding inflammatory changes.   Spleen: Normal in size without significant abnormality.   Adrenals/Urinary Tract: Probable  contrast enhancing left adrenal nodule measuring 1.7 x 1.5 cm (series 12, image 40). Kidneys are normal, without obvious renal calculi, solid lesion, or hydronephrosis. Bladder is unremarkable.   Stomach/Bowel: Stomach is within normal limits. Appendix appears normal. Small bowel is moderately distended by oral enteric contrast. No evidence of bowel wall thickening, distention, or inflammatory changes. Moderate burden of stool in the colon.   Vascular/Lymphatic: No significant vascular findings are present. No enlarged abdominal or pelvic lymph nodes.   Reproductive: Status post hysterectomy. Tiny ovarian cysts or follicles, benign, requiring no further follow-up or characterization.   Other: Diastasis recti.  No ascites.   Musculoskeletal: No acute or significant osseous findings.   IMPRESSION: 1. No MR evidence of inflammatory bowel disease. No inflammatory findings in the abdomen  or pelvis. No secondary findings such as bowel obstruction. 2. Probable contrast enhancing left adrenal nodule measuring 1.7 x 1.5 cm. This is statistically most likely to reflect an adrenal adenoma, however is incompletely characterized on this examination which does not include in and opposed phase imaging to assess for lipid content. Given size and high likelihood of benign character, consider follow-up adrenal protocol CT or MR in 1 year. 3. Cholelithiasis. 4. Cardiomegaly.  GI PROCEDURES:  Colonoscopy 03/05/2024: - Hemorrhoids found on perianal exam.  - Scattered mild inflammation was found in the entire examined colon. Biopsied.  - The examined portion of the ileum was normal with the exception of 2 small erosions in the terminal ileum just beyond the os of the ileocecal valve. Biopsied.  - Internal hemorrhoids.  - Findings today are indicative of active inflammatory bowel disease?diagnosis remains indeterminate between Crohn's disease and ulcerative colitis. Inflammatory changes are overall mild.  1. Surgical [P], small bowel, terminal ileum biopsy :      - BENIGN ILEAL MUCOSA WITH MILD NON-SPECIFIC CHRONIC INFLAMMATION AND      ARCHITECTURAL DISARRAY      - NEGATIVE FOR ACTIVE INFLAMMATION      - NEGATIVE FOR DYSPLASIA OR MALIGNANCY       2. Surgical [P], cecum biopsy :      - CHRONIC MODERATELY ACTIVE COLITIS      - NEGATIVE FOR DYSPLASIA OR MALIGNANCY       3. Surgical [P], ascending colon biopsy :      - CHRONIC MILDLY ACTIVE COLITIS      - NEGATIVE FOR DYSPLASIA OR MALIGNANCY       4. Surgical [P], transverse colon biopsy :      - CHRONIC MILDLY ACTIVE COLITIS      - NEGATIVE FOR DYSPLASIA OR MALIGNANCY       5. Surgical [P], descending colon biopsy :      - CHRONIC MILDLY ACTIVE COLITIS      - NEGATIVE FOR DYSPLASIA OR MALIGNANCY       6. Surgical [P], sigmoid colon biopsy :      - BENIGN COLONIC MUCOSA WITH MILD ARCHITECTURAL DISARRAY      - NEGATIVE FOR ACUTE  INFLAMMATION OR GRANULOMAS      - NEGATIVE FOR DYSPLASIA OR MALIGNANCY       7. Surgical [P], colon, rectum biopsy :      - CHRONIC INACTIVE PROCTITIS      - NEGATIVE FOR DYSPLASIA OR MALIGNANCY  Colonoscopy 08/10/2013 by Dr. Renaye Sous: Mucosal ulceration with friability noted throughout the colon-biopsies done to rule out dysplasia Multiple erosions in the terminal ileum-biopsied for pathology Repeat surveillance colonoscopy in 2 years  EGD 12/26/1989: Normal  esophagus Minimal gastritis Minimal duodenitis  Colonoscopy 12/26/1989: Colon from rectum to terminal ileum mild colitis, possible amebiasis  Sigmoidoscopy 04/22/1989: Rectal pseudopolyp Mild colitis  EGD 12/22/1987:  2 cm hiatal hernia Esophagitis  Sigmoidoscopy 09/15/1985: Normal to proximal sigmoid colon. Internal and external hemorrhoids were friable and thought to be the source of bleeding, this was further confirmed by anoscopy examination.  No evidence of active colitis.  Colonoscopy 08/19/1985 by Dr. Juliane Plough: Chronic ulcerative proctitis-presently in remission  Colonoscopy 1983 by Dr. Sheppard Finn:   Past Medical History:  Diagnosis Date   Colitis    Hip joint pain    History of stomach ulcers    Influenza vaccine refused 9/14   Nocturnal enuresis    history of, resolved   Obesity    Urge incontinence    Uterine fibroid    s/p hysterectomy   Wears glasses    Past Surgical History:  Procedure Laterality Date   ABDOMINAL HYSTERECTOMY     due to fibroids; partial; has both ovaries   CESAREAN SECTION     COLONOSCOPY     pending 08/10/13, Dr. Kristie   HIP SURGERY     KNEE ARTHROSCOPY     WISDOM TOOTH EXTRACTION     Current Outpatient Medications on File Prior to Visit  Medication Sig Dispense Refill   cyclobenzaprine  (FLEXERIL ) 5 MG tablet Take 1 tablet (5 mg total) by mouth 3 (three) times daily as needed for muscle spasms. 10 tablet 0   Vitamin D , Ergocalciferol , (DRISDOL ) 1.25 MG (50000  UNIT) CAPS capsule Take 1 capsule (50,000 Units total) by mouth every 7 (seven) days. 12 capsule 0   No current facility-administered medications on file prior to visit.   Allergies  Allergen Reactions   Oxycodone -Acetaminophen  Itching   Current Medications, Allergies, Past Medical History, Past Surgical History, Family History and Social History were reviewed in [employer] record.  Review of Systems:   Constitutional: Negative for fever, sweats, chills or weight loss.  Respiratory: Negative for shortness of breath.   Cardiovascular: Negative for chest pain, palpitations and leg swelling.  Gastrointestinal: See HPI.  Musculoskeletal: Knee pain. Neurological: Negative for dizziness, headaches or paresthesias.   Physical Exam: BP 130/62   Pulse 85   Ht 5' 2 (1.575 m)   Wt 239 lb (108.4 kg)   SpO2 99%   BMI 43.71 kg/m   Wt Readings from Last 3 Encounters:  04/08/24 239 lb (108.4 kg)  03/05/24 233 lb (105.7 kg)  02/14/24 233 lb (105.7 kg)    General: 63 year old female in no acute distress. Head: Normocephalic and atraumatic. Eyes: No scleral icterus. Conjunctiva pink . Ears: Normal auditory acuity. Mouth: Dentition intact. No ulcers or lesions.  Lungs: Clear throughout to auscultation. Heart: Regular rate and rhythm, no murmur. Abdomen: Soft, obese abdomen. Nontender and nondistended. No masses or hepatomegaly. Normal bowel sounds x 4 quadrants.  Rectal: Deferred.  Musculoskeletal: Symmetrical with no gross deformities. Extremities: No edema. Neurological: Alert oriented x 4. No focal deficits.  Psychological: Alert and cooperative. Normal mood and affect  Assessment and Recommendations:  63 year old female initially diagnosed with ulcerative colitis at the age of 16, treated with Azulfidine for 3 years in the 1980s and subsequent colonoscopies showed possible Crohn's disease. Her most recent colonoscopy 03/05/2024 findings were consistent with  indeterminate colitis, likely Crohn's disease.  MR enterography showed a normal small bowel.  Patient remains asymptomatic. -CBC, CMP, CRP, Sed rate, vitamin D  level and fecal  calprotectin level -I discussed starting oral Mesalamine 1.2 gm four tabs daily due to evidence of active colitis per colonoscopy 03/05/2024. Patient does not wish to start Mesalamine at this time as she is asymptomatic and she prefers natural treatments, to re-discuss after fecal calprotectin results reviewed  -Further recommendations and follow up to be determined after the above lab results reviewed  -Continue   Left adrenal nodule identified per MR enterography 03/2024 - Patient instructed to follow-up with PCP regarding further evaluation/surveillance imaging  I have reviewed the clinic note as outlined by Elida Shawl and agree with the assessment, plan and medical decision making.   Ms. Schuff returns to the gastroenterology office status post colonoscopy demonstrating scattered inflammation throughout her colon and 2 small erosions in the terminal ileum.  She has carried a diagnosis of IBD that has been classified both as ulcerative colitis and possible Crohn's disease.  MRE was obtained after colonoscopy and shows normal small bowel.  She was offered a trial of mesalamine but defers on this as she is asymptomatic and prefers natural treatments.  Agree with obtaining laboratory studies.  She has had a mild phenotype which is fortunate.  There is some risk of developing dysplasia and increase in colon cancer risk in the setting of ongoing inflammation that can be reviewed with the patient in the future.  Inocente Hausen, MD

## 2024-04-08 NOTE — Patient Instructions (Addendum)
 _______________________________________________________  If your blood pressure at your visit was 140/90 or greater, please contact your primary care physician to follow up on this.  _______________________________________________________  If you are age 61 or older, your body mass index should be between 23-30. Your Body mass index is 43.71 kg/m. If this is out of the aforementioned range listed, please consider follow up with your Primary Care Provider.  If you are age 107 or younger, your body mass index should be between 19-25. Your Body mass index is 43.71 kg/m. If this is out of the aformentioned range listed, please consider follow up with your Primary Care Provider.   ________________________________________________________  The [facility] providers would like to encourage you to use MYCHART to communicate with providers for non-urgent requests or questions.  Due to long hold times on the telephone, sending your provider a message by [facility] may be a faster and more efficient way to get a response.  Please allow 48 business hours for a response.  Please remember that this is for non-urgent requests.  _______________________________________________________  Your provider has requested that you go to the basement level for lab work before leaving today. Press B on the elevator. The lab is located at the first door on the left as you exit the elevator.  Contact our office when you are ready to start mesalamine  Thank you for trusting me with your gastrointestinal care!   Elida Shawl, CRNP

## 2024-04-09 ENCOUNTER — Other Ambulatory Visit: Payer: Self-pay | Admitting: Internal Medicine

## 2024-04-09 NOTE — Telephone Encounter (Signed)
 Copied from CRM [id number]. Topic: Clinical - Medication Refill >> Apr 09, 2024  5:02 PM Chiquita SQUIBB wrote: Medication:  Vitamin D  (Ergocalciferol ) Vitamin D , Ergocalciferol , (DRISDOL ) 1.25 MG (50000 UNIT) CAPS capsule   Has the patient contacted their pharmacy? Yes- Advised to call the doctor  (Agent: If no, request that the patient contact the pharmacy for the refill. If patient does not wish to contact the pharmacy document the reason why and proceed with request.) (Agent: If yes, when and what did the pharmacy advise?)  This is the patient's preferred pharmacy:  [id number] [place], [place] - [address] [place] [address] Phone: [phone] Fax: [phone]  Is this the correct pharmacy for this prescription? Yes If no, delete pharmacy and type the correct one.   Has the prescription been filled recently? No  Is the patient out of the medication? Yes  Has the patient been seen for an appointment in the last year OR does the patient have an upcoming appointment? Yes  Can we respond through MyChart? Yes  Agent: Please be advised that Rx refills may take up to 3 business days. We ask that you follow-up with your pharmacy.

## 2024-04-13 ENCOUNTER — Ambulatory Visit: Payer: Self-pay | Admitting: Nurse Practitioner

## 2024-04-13 DIAGNOSIS — D649 Anemia, unspecified: Secondary | ICD-10-CM

## 2024-04-13 MED ORDER — VITAMIN D (ERGOCALCIFEROL) 1.25 MG (50000 UNIT) PO CAPS: 50000.0000 [IU] | ORAL_CAPSULE | ORAL | 0 refills | Status: AC

## 2024-04-20 ENCOUNTER — Other Ambulatory Visit (INDEPENDENT_AMBULATORY_CARE_PROVIDER_SITE_OTHER): Payer: Self-pay

## 2024-04-20 ENCOUNTER — Ambulatory Visit: Payer: Self-pay | Admitting: Nurse Practitioner

## 2024-04-20 DIAGNOSIS — K523 Indeterminate colitis: Secondary | ICD-10-CM

## 2024-04-20 DIAGNOSIS — D649 Anemia, unspecified: Secondary | ICD-10-CM

## 2024-04-20 LAB — IBC + FERRITIN
Ferritin: 25.4 ng/mL (ref 10.0–291.0)
Iron: 60 ug/dL (ref 42–145)
Saturation Ratios: 18.6 % — ABNORMAL LOW (ref 20.0–50.0)
TIBC: 323.4 ug/dL (ref 250.0–450.0)
Transferrin: 231 mg/dL (ref 212.0–360.0)

## 2024-04-20 LAB — VITAMIN B12: Vitamin B-12: 523 pg/mL (ref 211–911)

## 2024-04-20 LAB — FOLATE: Folate: 10.5 ng/mL (ref >5.9)

## 2024-04-22 LAB — CALPROTECTIN, FECAL: Calprotectin, Fecal: 550 ug/g — ABNORMAL HIGH (ref 0–120)

## 2024-04-27 ENCOUNTER — Ambulatory Visit: Payer: Self-pay

## 2024-04-27 ENCOUNTER — Ambulatory Visit: Payer: Self-pay | Admitting: Emergency Medicine

## 2024-04-27 ENCOUNTER — Encounter: Payer: Self-pay | Admitting: Emergency Medicine

## 2024-04-27 VITALS — BP 140/80 | HR 87 | Temp 98.3°F | Ht 62.0 in | Wt 238.0 lb

## 2024-04-27 DIAGNOSIS — M542 Cervicalgia: Secondary | ICD-10-CM

## 2024-04-27 MED ORDER — CYCLOBENZAPRINE HCL 10 MG PO TABS: 10.0000 mg | ORAL_TABLET | Freq: Every day | ORAL | 0 refills | Status: DC

## 2024-04-27 MED ORDER — MELOXICAM 15 MG PO TABS: 15.0000 mg | ORAL_TABLET | Freq: Every day | ORAL | 0 refills | Status: DC

## 2024-04-27 NOTE — Assessment & Plan Note (Signed)
 Clinical stable.  No red flag signs or symptoms Has had similar symptoms before No associated symptoms Recommend Flexeril  10 mg at bedtime and daily meloxicam  50 mg for at least 7 days Advised to use heat pad several times during the day and avoid strenuous activities May benefit from physical therapy. Recommend follow-up with PCP if no better or worse during the next couple weeks

## 2024-04-27 NOTE — Progress Notes (Signed)
 Sonya White 63 y.o.   Chief Complaint  Patient presents with   Ear Pain    Patient is here for bilateral ear pain. Patient states behind her right ear she's been getting a throbbing pain, and also having neck pain on both sides. Started 2 month ago, only taking ibuprofen for the pain, only helps a bit.      HISTORY OF PRESENT ILLNESS: Acute problem visit today This is a 63 y.o. female complaining of mostly right-sided neck pain radiating behind right ear started about 2 months ago No other associated symptoms No other complaints or medical concerns today.  HPI   Prior to Admission medications   Medication Sig Start Date End Date Taking? Authorizing Provider  cyclobenzaprine  (FLEXERIL ) 10 MG tablet Take 1 tablet (10 mg total) by mouth at bedtime. 04/27/24  Yes Aritzel Krusemark, Emil Schanz, MD  meloxicam  (MOBIC ) 15 MG tablet Take 1 tablet (15 mg total) by mouth daily for 10 days. 04/27/24 05/07/24 Yes Misael Mcgaha, Emil Schanz, MD  Vitamin D , Ergocalciferol , (DRISDOL ) 1.25 MG (50000 UNIT) CAPS capsule Take 1 capsule (50,000 Units total) by mouth every 7 (seven) days. 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   Bilateral chronic knee pain 01/07/2024   Encounter for general adult medical examination with abnormal findings 01/07/2024   Neck pain 01/07/2024   Prediabetes 03/31/2020   Vitamin D  deficiency 03/31/2020   Restless leg 10/06/2019   Pain in inferior right lower extremity 10/06/2019   Chronic right-sided low back pain with right-sided sciatica 10/06/2019   Morbid obesity (HCC) 10/31/2011    Past Medical History:  Diagnosis Date   Colitis    Hip joint pain    History of stomach ulcers    Influenza vaccine refused 9/14   Nocturnal enuresis    history of, resolved   Obesity    Urge incontinence    Uterine fibroid    s/p  hysterectomy   Wears glasses     Past Surgical History:  Procedure Laterality Date   ABDOMINAL HYSTERECTOMY     due to fibroids; partial; has both ovaries   CESAREAN SECTION     COLONOSCOPY     pending 08/10/13, Dr. Kristie   HIP SURGERY     KNEE ARTHROSCOPY     WISDOM TOOTH EXTRACTION      Social History   Socioeconomic History   Marital status: Divorced    Spouse name: Not on file   Number of children: Not on file   Years of education: Not on file   Highest education level: Not on file  Occupational History   Occupation: [employer] Engineer, drilling  Tobacco Use   Smoking status: Never   Smokeless tobacco: Never  Vaping Use   Vaping status: Never Used  Substance and Sexual Activity   Alcohol use: Yes    Alcohol/week: 20.0 standard drinks of alcohol    Types: 20 Shots of liquor per week    Comment: occasional   Drug use: No   Sexual activity: Not Currently  Other Topics Concern   Not on file  Social History Narrative   Driver for [employer], 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, [place] , walking for exercise;  Has 24yo and 28yo children   Social Drivers of Corporate investment banker Strain: Not on file  Food Insecurity: No Food Insecurity (04/30/2023)  Hunger Vital Sign    Worried About Running Out of Food in the Last Year: Never true    Ran Out of Food in the Last Year: Never true  Transportation Needs: No Transportation Needs (04/30/2023)   PRAPARE - Administrator, Civil Service (Medical): No    Lack of Transportation (Non-Medical): No  Physical Activity: Not on file  Stress: Not on file  Social Connections: Unknown (02/23/2022)   Received from [facility]   Social Network    Social Network: Not on file  Intimate Partner Violence: Unknown (01/15/2022)   Received from [facility]   HITS    Physically Hurt: Not on file    Insult or Talk Down To: Not on file    Threaten Physical Harm: Not on file    Scream or Curse: Not on file    Family History  Problem  Relation Age of Onset   Cancer Mother 57       multiple myeloma 63yo; breast 61yo   AAA (abdominal aortic aneurysm) Mother    Glaucoma Father    Sudden death Father    Glaucoma Sister    Heart disease Brother        thinks it was related to steroid abuse   Diabetes Neg Hx    Stroke Neg Hx    Hypertension Neg Hx    Colon cancer Neg Hx    Rectal cancer Neg Hx    Stomach cancer Neg Hx      Review of Systems  Constitutional: Negative.  Negative for chills and fever.  HENT: Negative.  Negative for congestion and sore throat.   Respiratory: Negative.  Negative for cough and shortness of breath.   Cardiovascular: Negative.  Negative for chest pain and palpitations.  Gastrointestinal:  Negative for abdominal pain, diarrhea, nausea and vomiting.  Musculoskeletal:  Positive for neck pain.  Skin: Negative.  Negative for rash.  Neurological: Negative.  Negative for dizziness and headaches.  All other systems reviewed and are negative.   Vitals:   04/27/24 1507  BP: (!) 140/80  Pulse: 87  Temp: 98.3 F (36.8 C)  SpO2: 97%    Physical Exam Vitals reviewed.  Constitutional:      Appearance: Normal appearance. She is obese.  HENT:     Head: Normocephalic.     Right Ear: Tympanic membrane, ear canal and external ear normal.     Left Ear: Tympanic membrane, ear canal and external ear normal.     Mouth/Throat:     Mouth: Mucous membranes are moist.     Pharynx: Oropharynx is clear.  Eyes:     Extraocular Movements: Extraocular movements intact.     Conjunctiva/sclera: Conjunctivae normal.     Pupils: Pupils are equal, round, and reactive to light.  Cardiovascular:     Rate and Rhythm: Normal rate and regular rhythm.     Pulses: Normal pulses.     Heart sounds: Normal heart sounds.  Pulmonary:     Effort: Pulmonary effort is normal.     Breath sounds: Normal breath sounds.  Musculoskeletal:     Cervical back: Muscular tenderness present. Decreased range of motion.  Skin:     General: Skin is warm and dry.     Findings: No rash.  Neurological:     Mental Status: She is alert and oriented to person, place, and time.  Psychiatric:        Mood and Affect: Mood normal.  Behavior: Behavior normal.      ASSESSMENT & PLAN: A total of 32 minutes was spent with the patient and counseling/coordination of care regarding preparing for this visit, review of most recent office visit notes, review of chronic medical conditions under management, review of all medications, pain management, prognosis, documentation, and need for follow-up if no better or worse during the next several days to weeks.  Problem List Items Addressed This Visit       Other   Musculoskeletal neck pain - Primary   Clinical stable.  No red flag signs or symptoms Has had similar symptoms before No associated symptoms Recommend Flexeril  10 mg at bedtime and daily meloxicam  50 mg for at least 7 days Advised to use heat pad several times during the day and avoid strenuous activities May benefit from physical therapy. Recommend follow-up with PCP if no better or worse during the next couple weeks      Relevant Medications   cyclobenzaprine  (FLEXERIL ) 10 MG tablet   meloxicam  (MOBIC ) 15 MG tablet   Patient Instructions  Muscle and Joint Pain: What It Means Muscle and joint pain is aches and pains in your bones, joints, muscles, and tissues that surround them. This pain can happen in any part of the body. It can last for a short time or a long time. To find the cause of the pain, you may have: A physical exam. Lab tests. Imaging tests. Follow these instructions at home: Medicines Take your medicines only as told. Treatment may include medicines for pain and inflammation. You might need to take medicines by mouth or put them on your skin. Managing pain, stiffness, and swelling     Use ice or an ice pack as told. Place a towel between your skin and the ice. Leave the ice on for 20  minutes, 2-3 times a day. Use heat as told. Use the heat source that your health care provider recommends, such as a moist heat pack or a heating pad. Do this as often as told. Place a towel between your skin and the heat source. Leave the heat on for 20-30 minutes. If your skin turns red, take off the ice or heat right away to prevent burns. The risk of burns is higher if you can't feel pain, heat, or cold. Activity When your pain is very bad, bed rest may be helpful. Lie or sit in any position that's comfortable. Get up to take short walks at least every 2 hours during the day. This helps you breathe better and keeps your blood flowing. Ask for help if you feel weak or unsteady. You may continue doing your usual activities unless the activities cause more pain. When the pain gets better, slowly increase the time and intensity of your activities or exercise. Lifestyle Learn to manage anxiety and stress. Stress increases muscle tension and can make pain worse. Ask your provider if you should try: Meditation or yoga. Therapy. Acupuncture or massage. General instructions Your provider may tell you to see a physical therapist to help you do exercises to improve movement and strength in the affected area. Exercise as told. Contact a health care provider if: Your pain gets worse. Medicines do not help your pain. You can't use the part of your body that hurts, such as your arm, leg, or neck. You have trouble sleeping. You have trouble doing your usual activities. Get help right away if: You have a new injury and your pain is worse or different. You feel numb  or you have tingling in the painful area. This information is not intended to replace advice given to you by your health care provider. Make sure you discuss any questions you have with your health care provider. Document Revised: 03/01/2023 Document Reviewed: 02/13/2023 Elsevier Patient Education  2024 Elsevier Inc.     Emil Schaumann, MD [facility]

## 2024-04-27 NOTE — Telephone Encounter (Signed)
 FYI Only or Action Required?: FYI only for provider.  Patient was last seen in primary care on 01/07/2024 by Rollene Almarie LABOR, MD.  Called Nurse Triage reporting Otalgia.  Symptoms began several months ago.  Interventions attempted: OTC medications: ibuprofen.  Symptoms are: unchanged.  Triage Disposition: See HCP Within 4 Hours (Or PCP Triage)  Patient/caregiver understands and will follow disposition?: Yes                             Copied from CRM [id number]. Topic: Clinical - Red Word Triage >> Apr 27, 2024  1:58 PM [place] J wrote: [facility] that prompted transfer to Nurse Triage: Patient purchased new glasses 2 and half months ago and is now having sever pain behind her ears. She stated that the pain is so bad that she can hardly lift her head.   She did state that she has been taking Ibuprofen 750mg  every 6 hours for the pain and would like to know if Dr. Rollene could prescribe anything to help manage the pain. Reason for Disposition  [1] SEVERE pain (e.g., excruciating) and [2] not improved 2 hours after pain medicine (e.g., acetaminophen  or ibuprofen)  Answer Assessment - Initial Assessment Questions 1. LOCATION: Which ear is involved?     Behind bilateral ears 2. ONSET: When did the ear pain start?      2-3 months ago 3. SEVERITY: How bad is the pain?  (Scale 1-10; mild, moderate or severe)     Rates pain a 20  4. URI SYMPTOMS: Do you have a runny nose or cough?     Denies 5. FEVER: Do you have a fever? If Yes, ask: What is your temperature, how was it measured, and when did it start?     Denies 6. CAUSE: Have you been swimming recently?, How often do you use Q-TIPS?, Have you had any recent air travel or scuba diving?     New glasses that did not fit well  7. OTHER SYMPTOMS: Do you have any other symptoms? (e.g., decreased hearing, dizziness, headache, stiff neck, vomiting)     Area behind ears feels mildly swollen,  states she has stiff neck at baseline, denies headache  Protocols used: Rilla

## 2024-04-27 NOTE — Patient Instructions (Signed)
 Muscle and Joint Pain: What It Means Muscle and joint pain is aches and pains in your bones, joints, muscles, and tissues that surround them. This pain can happen in any part of the body. It can last for a short time or a long time. To find the cause of the pain, you may have: A physical exam. Lab tests. Imaging tests. Follow these instructions at home: Medicines Take your medicines only as told. Treatment may include medicines for pain and inflammation. You might need to take medicines by mouth or put them on your skin. Managing pain, stiffness, and swelling     Use ice or an ice pack as told. Place a towel between your skin and the ice. Leave the ice on for 20 minutes, 2-3 times a day. Use heat as told. Use the heat source that your health care provider recommends, such as a moist heat pack or a heating pad. Do this as often as told. Place a towel between your skin and the heat source. Leave the heat on for 20-30 minutes. If your skin turns red, take off the ice or heat right away to prevent burns. The risk of burns is higher if you can't feel pain, heat, or cold. Activity When your pain is very bad, bed rest may be helpful. Lie or sit in any position that's comfortable. Get up to take short walks at least every 2 hours during the day. This helps you breathe better and keeps your blood flowing. Ask for help if you feel weak or unsteady. You may continue doing your usual activities unless the activities cause more pain. When the pain gets better, slowly increase the time and intensity of your activities or exercise. Lifestyle Learn to manage anxiety and stress. Stress increases muscle tension and can make pain worse. Ask your provider if you should try: Meditation or yoga. Therapy. Acupuncture or massage. General instructions Your provider may tell you to see a physical therapist to help you do exercises to improve movement and strength in the affected area. Exercise as  told. Contact a health care provider if: Your pain gets worse. Medicines do not help your pain. You can't use the part of your body that hurts, such as your arm, leg, or neck. You have trouble sleeping. You have trouble doing your usual activities. Get help right away if: You have a new injury and your pain is worse or different. You feel numb or you have tingling in the painful area. This information is not intended to replace advice given to you by your health care provider. Make sure you discuss any questions you have with your health care provider. Document Revised: 03/01/2023 Document Reviewed: 02/13/2023 Elsevier Patient Education  2024 [employer].

## 2024-05-08 ENCOUNTER — Other Ambulatory Visit: Payer: Self-pay | Admitting: Emergency Medicine

## 2024-05-08 DIAGNOSIS — M542 Cervicalgia: Secondary | ICD-10-CM

## 2024-05-08 NOTE — Telephone Encounter (Unsigned)
 Copied from CRM [id number]. Topic: Clinical - Medication Refill >> May 08, 2024 12:59 PM Jasmin G wrote: Medication: Meloxicam  15 mg Oral Daily  Has the patient contacted their pharmacy? No (Agent: If no, request that the patient contact the pharmacy for the refill. If patient does not wish to contact the pharmacy document the reason why and proceed with request.) (Agent: If yes, when and what did the pharmacy advise?)  This is the patient's preferred pharmacy:  [id number] [place], [place] - [address] [place] [address] Phone: [phone] Fax: [phone]  Is this the correct pharmacy for this prescription? Yes If no, delete pharmacy and type the correct one.   Has the prescription been filled recently? Yes  Is the patient out of the medication? Yes  Has the patient been seen for an appointment in the last year OR does the patient have an upcoming appointment? Yes. Pt was recently prescribed this medication for pain management and needs another refill, she will be out of this medication today.  Can we respond through MyChart? No  Agent: Please be advised that Rx refills may take up to 3 business days. We ask that you follow-up with your pharmacy.

## 2024-05-24 ENCOUNTER — Other Ambulatory Visit: Payer: Self-pay | Admitting: Emergency Medicine

## 2024-05-24 DIAGNOSIS — M542 Cervicalgia: Secondary | ICD-10-CM

## 2024-05-28 ENCOUNTER — Other Ambulatory Visit: Payer: Self-pay | Admitting: Internal Medicine

## 2024-06-09 ENCOUNTER — Ambulatory Visit: Payer: Self-pay

## 2024-06-09 ENCOUNTER — Encounter: Payer: Self-pay | Admitting: Internal Medicine

## 2024-06-09 ENCOUNTER — Ambulatory Visit

## 2024-06-09 ENCOUNTER — Ambulatory Visit (INDEPENDENT_AMBULATORY_CARE_PROVIDER_SITE_OTHER): Admitting: Internal Medicine

## 2024-06-09 VITALS — BP 140/82 | HR 82 | Temp 98.4°F | Ht 62.0 in | Wt 244.0 lb

## 2024-06-09 DIAGNOSIS — M542 Cervicalgia: Secondary | ICD-10-CM | POA: Diagnosis not present

## 2024-06-09 DIAGNOSIS — G8929 Other chronic pain: Secondary | ICD-10-CM

## 2024-06-09 DIAGNOSIS — M25562 Pain in left knee: Secondary | ICD-10-CM | POA: Diagnosis not present

## 2024-06-09 DIAGNOSIS — M25561 Pain in right knee: Secondary | ICD-10-CM

## 2024-06-09 DIAGNOSIS — R030 Elevated blood-pressure reading, without diagnosis of hypertension: Secondary | ICD-10-CM | POA: Diagnosis not present

## 2024-06-09 DIAGNOSIS — M47812 Spondylosis without myelopathy or radiculopathy, cervical region: Secondary | ICD-10-CM | POA: Diagnosis not present

## 2024-06-09 MED ORDER — METHYLPREDNISOLONE ACETATE 40 MG/ML IJ SUSP
40.0000 mg | Freq: Once | INTRAMUSCULAR | Status: AC
  Administered 2024-06-09: 40 mg via INTRAMUSCULAR

## 2024-06-09 NOTE — Patient Instructions (Signed)
 We have done the steroid shot today and will check the x-ray

## 2024-06-09 NOTE — Progress Notes (Signed)
 "  Subjective:   Patient ID: Sonya White, female    DOB: 05/09/1961, 63 y.o.   MRN: [id number]  Discussed the use of AI scribe software for clinical note transcription with the patient, who gave verbal consent to proceed.  History of Present Illness Sonya White is a 63 year old female who presents with neck pain and swelling.  She has been experiencing significant neck pain and swelling for the past one to two months. The pain is severe, particularly on the right side, and is sometimes accompanied by numbness. It is exacerbated by movement, such as turning her head while driving, and is present at night, requiring her to lift her head to turn it. She has been using meloxicam  and ibuprofen, which provide some relief, but the pain persists, especially with quick movements.  The pain began after getting new glasses about two and a half months ago, which were initially tight and may have caused some damage. Despite changing to lighter glasses over a month ago, the pain and swelling have not improved and have worsened. No recent injuries, falls, or accidents that could have contributed to her symptoms.  In addition to neck pain, she reports right shoulder pain that has developed recently, which she suspects may be related to her neck issues. The shoulder pain is particularly bothersome at night and limits her range of motion.  Her past medical history includes a CT angiogram of the head and neck performed last year, which showed minimal arthritis at C6 and C7. She is frustrated with the persistent pain and swelling, which impacts her daily activities, including driving, and causes headaches.   Review of Systems  Constitutional: Negative.   HENT: Negative.    Eyes: Negative.   Respiratory:  Negative for cough, chest tightness and shortness of breath.   Cardiovascular:  Negative for chest pain, palpitations and leg swelling.  Gastrointestinal:  Negative for abdominal distention, abdominal pain,  constipation, diarrhea, nausea and vomiting.  Musculoskeletal:  Positive for arthralgias, myalgias and neck pain.  Skin: Negative.   Neurological: Negative.   Psychiatric/Behavioral: Negative.      Objective:  Physical Exam Constitutional:      Appearance: She is well-developed.  HENT:     Head: Normocephalic and atraumatic.  Cardiovascular:     Rate and Rhythm: Normal rate and regular rhythm.  Pulmonary:     Effort: Pulmonary effort is normal. No respiratory distress.     Breath sounds: Normal breath sounds. No wheezing or rales.  Abdominal:     General: Bowel sounds are normal. There is no distension.     Palpations: Abdomen is soft.     Tenderness: There is no abdominal tenderness. There is no rebound.  Musculoskeletal:        General: Tenderness present.     Cervical back: Normal range of motion.  Skin:    General: Skin is warm and dry.  Neurological:     Mental Status: She is alert and oriented to person, place, and time.     Coordination: Coordination normal.     Vitals:   06/09/24 1501 06/09/24 1509  BP: (!) 140/82 (!) 140/82  Pulse: 82   Temp: 98.4 F (36.9 C)   TempSrc: Oral   SpO2: 97%   Weight: 244 lb (110.7 kg)   Height: 5' 2 (1.575 m)   Depo-medrol  40 mg IM given at visit  Assessment and Plan Assessment & Plan Chronic right neck pain with swelling and limited range of motion  Chronic right neck pain with swelling and limited range of motion has worsened over the past month or two. She experiences severe pain with numbness, primarily on the right side. A previous CT angiogram showed minimal arthritis at C6-C7 and no issues at C1-C2. The differential includes nerve irritation or inflammation. Current management with meloxicam  and muscle relaxers provides limited relief. Order an x-ray of the neck to assess for any changes or nerve impingement. Administer a depo-medrol  40 mg IM for its anti-inflammatory effect.  Right shoulder pain   Right shoulder pain  may be related to neck pain. The pain is increasing, present at night, and affects sleep.  Bilateral knee pain with swelling and popping   Chronic bilateral knee pain with swelling and popping. Current management with meloxicam  and ibuprofen provides some relief. Depo-medrol  40 mg IM will hopefully help some with this as well.    "

## 2024-06-09 NOTE — Telephone Encounter (Signed)
 FYI Only or Action Required?: FYI only for provider.  Patient was last seen in primary care on 04/27/2024 by Purcell Emil Schanz, MD.  Called Nurse Triage reporting Headache.  Symptoms began several months ago.  Interventions attempted: Prescription medications: Ibuprofen, Meloxicam .  Symptoms are: gradually worsening.  Triage Disposition: See PCP When Office is Open (Within 3 Days)  Patient/caregiver understands and will follow disposition?: Yes         Copied from CRM #[id number]. Topic: Clinical - Red Word Triage >> Jun 09, 2024  9:53 AM Frederich PARAS wrote: [facility] that prompted transfer to Nurse Triage: pain pt calling to schedule an apt, but during the call the  reason for the apt is back of head in pain. right side , changed glasses but still pain, pain is all day.         Reason for Disposition  [1] MODERATE neck pain (e.g., interferes with normal activities) AND [2] present > 3 days  Answer Assessment - Initial Assessment Questions 1. LOCATION: Where does it hurt?      Back of head and sides of head  2. ONSET: When did the headache start? (e.g., minutes, hours, days)      2.5 months  3. PATTERN: Does the pain come and go, or has it been constant since it started?     Intermittent  4. SEVERITY: How bad is the pain? and What does it keep you from doing?  (e.g., Scale 1-10; mild, moderate, or severe)     Moderate to severe  5. RECURRENT SYMPTOM: Have you ever had headaches before? If Yes, ask: When was the last time? and What happened that time?      No 6. CAUSE: What do you think is causing the headache?     Began after getting new glasses, stating they were too tight.  7. MIGRAINE: Have you been diagnosed with migraine headaches? If Yes, ask: Is this headache similar?      No 8. HEAD INJURY: Has there been any recent injury to your head?      No 9. OTHER SYMPTOMS: Do you have any other symptoms? (e.g., fever, stiff neck, eye pain, sore  throat, cold symptoms)     Neck pain which is not new with the head pain  Answer Assessment - Initial Assessment Questions 1. ONSET: When did the pain begin?      Chronic problem  2. LOCATION: Where does it hurt?      Right sided back, sometimes to the left 3. PATTERN Does the pain come and go, or has it been constant since it started?      Constant mild pain 4. SEVERITY: How bad is the pain?  (Scale 0-10; or none or slight stiffness, mild, moderate, severe)     Moderate to severe  5. RADIATION: Does the pain go anywhere else, shoot into your arms?     Radiates down back  6. CORD SYMPTOMS: Any weakness or numbness of the arms or legs?     No 7. CAUSE: What do you think is causing the neck pain?     Ongoing problem  8. NECK OVERUSE: Any recent activities that involved turning or twisting the neck?     No 9. OTHER SYMPTOMS: Do you have any other symptoms? (e.g., headache, fever, chest pain, difficulty breathing, neck swelling)     Head pain that began 2.5 months ago after getting new glasses  Protocols used: Headache-A-AH, Neck Pain or Stiffness-A-AH

## 2024-06-10 NOTE — Assessment & Plan Note (Signed)
 Checking x-ray due to persistence and worsening pain and depo-medrol  40 mg IM given today. Continue flexeril  and meloxicam .

## 2024-06-10 NOTE — Assessment & Plan Note (Signed)
 BP is borderline today without prior history of hypertension. She is in pain today so we will monitor closely. If persistently elevated will stop nsaids and monitor. If persistently elevated will dx with hypertension.

## 2024-06-10 NOTE — Assessment & Plan Note (Signed)
 Continue meloxicam  and flexeril  and depo-medrol  40 mg IM may help this improve temporarily as well. Discussed injections intra-articular.

## 2024-06-22 ENCOUNTER — Other Ambulatory Visit: Payer: Self-pay | Admitting: Internal Medicine

## 2024-06-22 ENCOUNTER — Ambulatory Visit: Payer: Self-pay | Admitting: Internal Medicine

## 2024-06-22 DIAGNOSIS — M542 Cervicalgia: Secondary | ICD-10-CM

## 2024-07-14 ENCOUNTER — Encounter: Payer: Self-pay | Admitting: *Deleted
